# Patient Record
Sex: Male | Born: 1980 | Race: Black or African American | Hispanic: No | Marital: Married | Smoking: Current every day smoker
Health system: Southern US, Community
[De-identification: ages and names within clinical notes are randomized; demographics above are authoritative.]

## PROBLEM LIST (undated history)

## (undated) DIAGNOSIS — K2970 Gastritis, unspecified, without bleeding: Secondary | ICD-10-CM

---

## 2007-08-23 ENCOUNTER — Emergency Department (HOSPITAL_COMMUNITY): Admission: EM | Admit: 2007-08-23 | Discharge: 2007-08-23 | Payer: Self-pay | Admitting: Emergency Medicine

## 2007-11-29 ENCOUNTER — Emergency Department (HOSPITAL_COMMUNITY): Admission: EM | Admit: 2007-11-29 | Discharge: 2007-11-29 | Payer: Self-pay | Admitting: Emergency Medicine

## 2008-03-11 ENCOUNTER — Emergency Department (HOSPITAL_COMMUNITY): Admission: EM | Admit: 2008-03-11 | Discharge: 2008-03-11 | Payer: Self-pay | Admitting: Emergency Medicine

## 2008-04-02 ENCOUNTER — Emergency Department (HOSPITAL_BASED_OUTPATIENT_CLINIC_OR_DEPARTMENT_OTHER): Admission: EM | Admit: 2008-04-02 | Discharge: 2008-04-02 | Payer: Self-pay | Admitting: Emergency Medicine

## 2009-10-28 ENCOUNTER — Emergency Department (HOSPITAL_BASED_OUTPATIENT_CLINIC_OR_DEPARTMENT_OTHER): Admission: EM | Admit: 2009-10-28 | Discharge: 2009-10-28 | Payer: Self-pay | Admitting: Emergency Medicine

## 2010-09-30 LAB — URINALYSIS, ROUTINE W REFLEX MICROSCOPIC
Hgb urine dipstick: NEGATIVE
Protein, ur: NEGATIVE mg/dL
Urobilinogen, UA: 1 mg/dL (ref 0.0–1.0)

## 2010-09-30 LAB — GC/CHLAMYDIA PROBE AMP, GENITAL: GC Probe Amp, Genital: NEGATIVE

## 2011-04-08 LAB — GC/CHLAMYDIA PROBE AMP, GENITAL: GC Probe Amp, Genital: NEGATIVE

## 2011-04-13 LAB — CBC
Hemoglobin: 17.4 — ABNORMAL HIGH
MCHC: 34.2
RDW: 12

## 2011-04-13 LAB — DIFFERENTIAL
Eosinophils Absolute: 0
Lymphocytes Relative: 25
Lymphs Abs: 1.6
Monocytes Relative: 6
Neutro Abs: 4.2
Neutrophils Relative %: 68

## 2011-04-13 LAB — COMPREHENSIVE METABOLIC PANEL
ALT: 15
Calcium: 9.8
Creatinine, Ser: 1
Glucose, Bld: 120 — ABNORMAL HIGH
Sodium: 144
Total Protein: 8.6 — ABNORMAL HIGH

## 2011-04-13 LAB — URINALYSIS, ROUTINE W REFLEX MICROSCOPIC
Glucose, UA: NEGATIVE
Leukocytes, UA: NEGATIVE
Nitrite: NEGATIVE
Specific Gravity, Urine: 1.029
pH: 8

## 2011-04-13 LAB — URINE MICROSCOPIC-ADD ON

## 2011-04-13 LAB — GASTRIC OCCULT BLOOD (1-CARD TO LAB)
Occult Blood, Gastric: POSITIVE — AB
pH, Gastric: 7

## 2011-04-21 ENCOUNTER — Encounter (HOSPITAL_BASED_OUTPATIENT_CLINIC_OR_DEPARTMENT_OTHER): Payer: Self-pay | Admitting: *Deleted

## 2011-04-21 ENCOUNTER — Emergency Department (HOSPITAL_BASED_OUTPATIENT_CLINIC_OR_DEPARTMENT_OTHER)
Admission: EM | Admit: 2011-04-21 | Discharge: 2011-04-21 | Disposition: A | Discharge status: Home / Self Care | Payer: No Typology Code available for payment source | Attending: Emergency Medicine | Admitting: Emergency Medicine

## 2011-04-21 DIAGNOSIS — L729 Follicular cyst of the skin and subcutaneous tissue, unspecified: Secondary | ICD-10-CM

## 2011-04-21 DIAGNOSIS — L723 Sebaceous cyst: Secondary | ICD-10-CM | POA: Insufficient documentation

## 2011-04-21 NOTE — ED Notes (Signed)
Pt states has an area he is concerned about in groin area states it has been there for "a long time" but started out as a small bump and is growing and becoming painful. Denies drainage. Pt also reports he has "knots " that have come up on his scalp when he touches them they itch and "swell up"

## 2011-04-21 NOTE — ED Provider Notes (Signed)
History     CSN: [id number] Arrival date & time: 04/21/2011  6:43 PM  Chief Complaint  Patient presents with  . Cyst    (Consider location/radiation/quality/duration/timing/severity/associated sxs/prior treatment) HPI Chronic left groin inguinal irregular cyst gradual onset over 4 months constantly present which is slowly enlarging with no redness and no pus drainage with no radiation and no associated symptoms. This is a very mild tenderness present 24 hours a day for months. He has no testicular pain no difficulty urinating no abdominal pain no vomiting or other concerns. History reviewed. No pertinent past medical history.  History reviewed. No pertinent past surgical history.  History reviewed. No pertinent family history.  History  Substance Use Topics  . Smoking status: Current Everyday Smoker -- 0.5 packs/day  . Smokeless tobacco: Not on file  . Alcohol Use: Yes     occ      Review of Systems  Constitutional: Negative for fever.       10 Systems reviewed and are negative for acute change except as noted in the HPI.  HENT: Negative for congestion.   Eyes: Negative for discharge and redness.  Respiratory: Negative for cough and shortness of breath.   Cardiovascular: Negative for chest pain.  Gastrointestinal: Negative for vomiting and abdominal pain.  Genitourinary: Negative for dysuria, flank pain and testicular pain.  Musculoskeletal: Negative for back pain.  Skin: Negative for rash.  Neurological: Negative for syncope, numbness and headaches.  Psychiatric/Behavioral:       No behavior change.    Allergies  Review of patient's allergies indicates no known allergies.  Home Medications   Current Outpatient Rx  Name Route Sig Dispense Refill  . NAPROXEN SODIUM 220 MG PO TABS Oral Take 440 mg by mouth 2 (two) times daily as needed. For pain     . BACITRACIN-NEOMYCIN-POLYMYXIN [id number] EX OINT Topical Apply topically daily. apply to eye       BP 143/77   Pulse 76  Temp(Src) 98.4 F (36.9 C) (Oral)  Resp 20  Ht 5\' 8"  (1.727 m)  Wt 260 lb (117.935 kg)  BMI 39.53 kg/m2  SpO2 99%  Physical Exam  Nursing note and vitals reviewed. Constitutional:       Awake, alert, nontoxic appearance.  HENT:  Head: Atraumatic.  Eyes: Right eye exhibits no discharge. Left eye exhibits no discharge.  Neck: Neck supple.  Cardiovascular: Normal rate and regular rhythm.   No murmur heard. Pulmonary/Chest: Effort normal and breath sounds normal. No respiratory distress. He has no wheezes. He has no rales. He exhibits no tenderness.  Abdominal: Soft. There is no tenderness. There is no rebound.  Genitourinary:       No testicular tenderness and no inguinal hernias palpated his left inguinal crease has approximately 1 cm x 3 cm irregular cyst like structure without fluctuance or erythema and minimal if any tenderness at all with limited bedside emergency department ultrasound showing an irregular echogenic appearance with only slight scattered anechoic regions suspected consistent with a complex cyst without need for emergency incision and drainage at this time  Musculoskeletal: Normal range of motion. He exhibits no edema and no tenderness.       Baseline ROM, no obvious new focal weakness.  Neurological:       Mental status and motor strength appears baseline for patient and situation.  Skin: No rash noted.  Psychiatric: He has a normal mood and affect.    ED Course  Procedures (including critical care time)  Labs Reviewed -  No data to display No results found.   1. Cyst of skin       MDM  I doubt any other EMC precluding discharge at this time including, but not necessarily limited to the following:SBI.        Hurman Horn, MD 04/28/11 [phone]

## 2011-04-24 ENCOUNTER — Ambulatory Visit (INDEPENDENT_AMBULATORY_CARE_PROVIDER_SITE_OTHER): Payer: No Typology Code available for payment source | Admitting: Surgery

## 2011-04-24 ENCOUNTER — Encounter (INDEPENDENT_AMBULATORY_CARE_PROVIDER_SITE_OTHER): Payer: Self-pay | Admitting: Surgery

## 2011-04-24 VITALS — BP 138/96 | HR 72 | Temp 97.8°F | Resp 16 | Ht 68.0 in | Wt 271.0 lb

## 2011-04-24 DIAGNOSIS — L732 Hidradenitis suppurativa: Secondary | ICD-10-CM | POA: Insufficient documentation

## 2011-04-24 NOTE — Patient Instructions (Signed)
Hidradenitis Suppurativa, Sweat Gland Abscess Hidradenitis suppurativa is a long lasting (chronic), uncommon disease of the sweat glands. With this, boil-like lumps and scarring develop in the groin, some times under the arms (axillae), and under the breasts. It may also uncommonly occur behind the ears, in the crease of the buttocks, and around the genitals.  CAUSES The cause is from a blocking of the sweat glands. They then become infected. It may cause drainage and odor. It is not contagious. So it cannot be given to someone else. It most often shows up in puberty (about 10 to 31 years of age). But it may happen much later. It is similar to acne which is a disease of the sweat glands. This condition is slightly more common in African-Americans and women. SYMPTOMS  Hidradenitis usually starts as one or more red, tender, swellings in the groin or under the arms (axilla).   Over a period of hours to days the lesions get larger. They often open to the skin surface, draining clear to yellow-colored fluid.   The infected area heals with scarring.  DIAGNOSIS Your caregiver makes this diagnosis by looking at you. Sometimes cultures (growing germs on plates in the lab) may be taken. This is to see what germ (bacterium) is causing the infection.  TREATMENT & HOME CARE INSTRUCTIONS  Topical germ killing medicine applied to the skin (antibiotics) are the treatment of choice. Antibiotics taken by mouth (systemic) are sometimes needed when the condition is getting worse or is severe.   Avoid tight-fitting clothing which traps moisture in.   Dirt does not cause hidradenitis and it is not caused by poor hygiene.   Involved areas should be cleaned daily using an antibacterial soap. Some patients find that the liquid form of Lever 2000, applied to the involved areas as a lotion after bathing, can help reduce the odor related to this condition.   Sometimes surgery is needed to drain infected areas or remove  scarred tissue. Removal of large amounts of tissue is used only in severe cases.   Birth control pills may be helpful.   Oral retinoids (vitamin A derivatives) for 6 to 12 months which are effective for acne may also help this condition.   Weight loss will improve but not cure hidradenitis. It is made worse by being overweight. But the condition is not caused by being overweight.   This condition is more common in people who have had acne.   It may become worse under stress.  There is no medical cure for hidradenitis. It can be controlled, but not cured. The condition usually continues for years with periods of getting worse and getting better (remission). Document Released: 02/11/2004 Document Re-Released: 04/07/2008 [employer] Patient Information 2011 [employer]. 

## 2011-04-24 NOTE — Progress Notes (Signed)
Chief Complaint  Patient presents with  . Other    Eval cyst near groin    HPI Frank Mclaughlin is a 30 y.o. male.  HPI The patient presents at the request of Dr. Fonnie Jarvis due to assist in his left groin. It has been present for a number of months. It is getting larger. It causes minimal discomfort there is no redness, drainage, or evidence of infection. He denies any fever or chills. He denies any change in color of the skin overlying the cyst in his left groin History reviewed. No pertinent past medical history.  History reviewed. No pertinent past surgical history.  History reviewed. No pertinent family history.  Social History History  Substance Use Topics  . Smoking status: Current Everyday Smoker -- 0.5 packs/day  . Smokeless tobacco: Not on file  . Alcohol Use: Yes     occ    No Known Allergies  Current Outpatient Prescriptions  Medication Sig Dispense Refill  . neomycin-bacitracin-polymyxin (NEOSPORIN) ointment Apply topically daily. apply to eye       . naproxen sodium (ANAPROX) 220 MG tablet Take 440 mg by mouth 2 (two) times daily as needed. For pain         Review of Systems Review of Systems  Constitutional: Negative for fever and chills.  HENT: Negative for facial swelling and neck pain.   Eyes: Negative.   Respiratory: Negative.   Cardiovascular: Negative.   Gastrointestinal: Negative.   Genitourinary: Negative.   Musculoskeletal: Negative.   Neurological: Negative.   Hematological: Negative.   Psychiatric/Behavioral: Negative.     Blood pressure 138/96, pulse 72, temperature 97.8 F (36.6 C), temperature source Temporal, resp. rate 16, height 5\' 8"  (1.727 m), weight 271 lb (122.925 kg).  Physical Exam Physical Exam  Constitutional: He is oriented to person, place, and time. He appears well-developed and well-nourished.  HENT:  Head: Normocephalic and atraumatic.  Nose: Nose normal.  Eyes: EOM are normal. Pupils are equal, round, and reactive to  light.  Neck: Normal range of motion. Neck supple.  Cardiovascular: Normal rate and regular rhythm.   No murmur heard. Pulmonary/Chest: Effort normal and breath sounds normal.  Abdominal: Soft. Bowel sounds are normal.  Musculoskeletal: Normal range of motion.  Neurological: He is alert and oriented to person, place, and time.  Skin:       Left groin hidradenitis measuring 4 cm x 5 cm.  No inflammation or tenderness.  Psychiatric: He has a normal mood and affect. His behavior is normal. Judgment and thought content normal.    Data Reviewed ED note  Assessment    Left inguinal hidradenitis     Plan    I discussed the condition with the patient today. He does have hidradenitis of his left groin and inguinal region measuring 4 x 5 cm. There is no signs of inflammation or infection. I discussed options of observation versus excision. I discussed potential risk of infection if excision is not done. I discussed the procedure of excising this area. Risks include bleeding, infection, recurrence, injury to nerves, blood vessels, and other neighboring structures. Other risks include blood clots, heart problems, stroke, pulmonary problems, death. She will call to schedule once he moves his work schedule.The likelihood of relieving the patient's symptoms with surgery is 50-75%.       Frank Rolfson A. 04/24/2011, 9:54 AM

## 2013-10-13 ENCOUNTER — Encounter (HOSPITAL_BASED_OUTPATIENT_CLINIC_OR_DEPARTMENT_OTHER): Payer: Self-pay | Admitting: Emergency Medicine

## 2013-10-13 ENCOUNTER — Emergency Department (HOSPITAL_BASED_OUTPATIENT_CLINIC_OR_DEPARTMENT_OTHER)
Admission: EM | Admit: 2013-10-13 | Discharge: 2013-10-13 | Disposition: A | Discharge status: Home / Self Care | Payer: No Typology Code available for payment source | Attending: Emergency Medicine | Admitting: Emergency Medicine

## 2013-10-13 DIAGNOSIS — F17200 Nicotine dependence, unspecified, uncomplicated: Secondary | ICD-10-CM | POA: Insufficient documentation

## 2013-10-13 DIAGNOSIS — J020 Streptococcal pharyngitis: Secondary | ICD-10-CM | POA: Insufficient documentation

## 2013-10-13 LAB — RAPID STREP SCREEN (MED CTR MEBANE ONLY): STREPTOCOCCUS, GROUP A SCREEN (DIRECT): POSITIVE — AB

## 2013-10-13 MED ORDER — KETOROLAC TROMETHAMINE 30 MG/ML IJ SOLN
30.0000 mg | Freq: Once | INTRAMUSCULAR | Status: AC
  Administered 2013-10-13: 30 mg via INTRAMUSCULAR
  Filled 2013-10-13: qty 1

## 2013-10-13 MED ORDER — LIDOCAINE VISCOUS 2 % MT SOLN
15.0000 mL | Freq: Once | OROMUCOSAL | Status: AC
  Administered 2013-10-13: 15 mL via OROMUCOSAL
  Filled 2013-10-13: qty 15

## 2013-10-13 MED ORDER — LIDOCAINE VISCOUS 2 % MT SOLN: 15.0000 mL | OROMUCOSAL | Status: AC | PRN

## 2013-10-13 MED ORDER — DEXAMETHASONE 4 MG PO TABS
ORAL_TABLET | ORAL | Status: AC
  Administered 2013-10-13: 10 mg
  Filled 2013-10-13: qty 3

## 2013-10-13 MED ORDER — DEXAMETHASONE 4 MG PO TABS: 10.0000 mg | ORAL_TABLET | Freq: Once | ORAL | Status: DC

## 2013-10-13 MED ORDER — PENICILLIN G BENZATHINE 1200000 UNIT/2ML IM SUSP
1.2000 10*6.[IU] | Freq: Once | INTRAMUSCULAR | Status: AC
  Administered 2013-10-13: 1.2 10*6.[IU] via INTRAMUSCULAR
  Filled 2013-10-13: qty 2

## 2013-10-13 NOTE — ED Notes (Signed)
Sore throat since Monday, no relief from OTC meds

## 2013-10-13 NOTE — ED Provider Notes (Signed)
CSN: [id number]     Arrival date & time 10/13/13  0230 History   First MD Initiated Contact with Patient 10/13/13 [phone]     Chief Complaint  Patient presents with  . Sore Throat     (Consider location/radiation/quality/duration/timing/severity/associated sxs/prior Treatment) HPI Patient presents with sore throat. Symptoms began 4 days ago without clear precipitant.  No relief from OTC medication. There is associated pain with swallowing, speaking, but no dyspnea, chest pain, fever, chills, no vomiting, no diarrhea. Patient's generally well. Patient smokes, was counseled on the need to stop this behavior.  History reviewed. No pertinent past medical history. History reviewed. No pertinent past surgical history. History reviewed. No pertinent family history. History  Substance Use Topics  . Smoking status: Current Every Day Smoker -- 0.50 packs/day  . Smokeless tobacco: Not on file  . Alcohol Use: Yes     Comment: occ    Review of Systems  Constitutional:       Per HPI, otherwise negative  HENT:       Per HPI, otherwise negative  Respiratory:       Per HPI, otherwise negative  Cardiovascular:       Per HPI, otherwise negative  Gastrointestinal: Negative for vomiting.  Endocrine:       Negative aside from HPI  Genitourinary:       Neg aside from HPI   Musculoskeletal:       Per HPI, otherwise negative  Skin: Negative.   Neurological: Negative for syncope.      Allergies  Review of patient's allergies indicates no known allergies.  Home Medications   Current Outpatient Rx  Name  Route  Sig  Dispense  Refill  . lidocaine (XYLOCAINE) 2 % solution   Mouth/Throat   Use as directed 15 mLs in the mouth or throat every 3 (three) hours as needed for mouth pain.   100 mL   0   . naproxen sodium (ANAPROX) 220 MG tablet   Oral   Take 440 mg by mouth 2 (two) times daily as needed. For pain          . neomycin-bacitracin-polymyxin (NEOSPORIN) ointment   Topical  Apply topically daily. apply to eye           BP 131/80  Pulse 77  Temp(Src) 98.7 F (37.1 C) (Oral)  Resp 20  SpO2 100% Physical Exam  Nursing note and vitals reviewed. Constitutional: He is oriented to person, place, and time. He appears well-developed. No distress.  HENT:  Head: Normocephalic and atraumatic.  Symmetrically enlarged tonsils bilaterally, erythematous, no gross discharge  Eyes: Conjunctivae and EOM are normal.  Cardiovascular: Normal rate and regular rhythm.   Pulmonary/Chest: Effort normal. No stridor. No respiratory distress.  Abdominal: He exhibits no distension.  Musculoskeletal: He exhibits no edema.  Lymphadenopathy:       Right cervical: No superficial cervical and no posterior cervical adenopathy present.      Left cervical: No superficial cervical and no posterior cervical adenopathy present.  Neurological: He is alert and oriented to person, place, and time.  Skin: Skin is warm and dry.  Psychiatric: He has a normal mood and affect.    ED Course  Procedures (including critical care time) Labs Review Labs Reviewed  RAPID STREP SCREEN - Abnormal; Notable for the following:    Streptococcus, Group A Screen (Direct) POSITIVE (*)    All other components within normal limits    MDM   Final diagnoses:  Strep throat  This generally well male presents with sore throat for several days.  On exam he is awake, alert, with no airway compromise, fever, evidence of distress.  Patient was discharged in stable condition after initiation of antibiotics.    Gerhard Munchobert Becca Bayne, MD 10/13/13 [phone]

## 2018-07-17 ENCOUNTER — Encounter (HOSPITAL_BASED_OUTPATIENT_CLINIC_OR_DEPARTMENT_OTHER): Payer: Self-pay | Admitting: Emergency Medicine

## 2018-07-17 ENCOUNTER — Emergency Department (HOSPITAL_BASED_OUTPATIENT_CLINIC_OR_DEPARTMENT_OTHER)
Admission: EM | Admit: 2018-07-17 | Discharge: 2018-07-17 | Disposition: A | Discharge status: Home / Self Care | Payer: No Typology Code available for payment source | Attending: Emergency Medicine | Admitting: Emergency Medicine

## 2018-07-17 ENCOUNTER — Emergency Department (HOSPITAL_BASED_OUTPATIENT_CLINIC_OR_DEPARTMENT_OTHER): Payer: No Typology Code available for payment source

## 2018-07-17 ENCOUNTER — Other Ambulatory Visit: Payer: Self-pay

## 2018-07-17 DIAGNOSIS — Z79899 Other long term (current) drug therapy: Secondary | ICD-10-CM | POA: Insufficient documentation

## 2018-07-17 DIAGNOSIS — F17200 Nicotine dependence, unspecified, uncomplicated: Secondary | ICD-10-CM | POA: Insufficient documentation

## 2018-07-17 DIAGNOSIS — J029 Acute pharyngitis, unspecified: Secondary | ICD-10-CM | POA: Insufficient documentation

## 2018-07-17 LAB — GROUP A STREP BY PCR: GROUP A STREP BY PCR: NOT DETECTED

## 2018-07-17 MED ORDER — PANTOPRAZOLE SODIUM 20 MG PO TBEC: 20.0000 mg | DELAYED_RELEASE_TABLET | Freq: Every day | ORAL | 0 refills | Status: AC

## 2018-07-17 MED ORDER — ALUM & MAG HYDROXIDE-SIMETH 200-200-20 MG/5ML PO SUSP
30.0000 mL | Freq: Once | ORAL | Status: AC
  Administered 2018-07-17: 30 mL via ORAL
  Filled 2018-07-17: qty 30

## 2018-07-17 NOTE — ED Triage Notes (Signed)
Sore throat and frequent hiccups since Thursday

## 2018-07-17 NOTE — ED Notes (Signed)
ED Provider at bedside. 

## 2018-07-17 NOTE — ED Provider Notes (Signed)
[facility] EMERGENCY DEPARTMENT Provider Note   CSN: [id number] Arrival date & time: 07/17/18  0915     History   Chief Complaint Chief Complaint  Patient presents with  . Sore Throat    HPI Frank Mclaughlin is a 38 y.o. male.  Patient is a 38 year old male who presents with a sore throat.  He states he feels like there is a lump in the middle of his throat.  He has pain on swallowing.  He says he feels like he has intermittent indigestion and when he gets a pain in the middle of his throat he has a painful hiccup.  He denies any shortness of breath.  No change in his voice.  He felt like he had a little fever yesterday but has not had any known fevers.  No runny nose or nasal congestion.  No vomiting.  No history of increased reflux.  No associated chest pain.     History reviewed. No pertinent past medical history.  Patient Active Problem List   Diagnosis Date Noted  . Hidradenitis 04/24/2011    History reviewed. No pertinent surgical history.      Home Medications    Prior to Admission medications   Medication Sig Start Date End Date Taking? Authorizing Provider  lidocaine (XYLOCAINE) 2 % solution Use as directed 15 mLs in the mouth or throat every 3 (three) hours as needed for mouth pain. 10/13/13   Gerhard Munch, MD  naproxen sodium (ANAPROX) 220 MG tablet Take 440 mg by mouth 2 (two) times daily as needed. For pain     [provider]  neomycin-bacitracin-polymyxin (NEOSPORIN) ointment Apply topically daily. apply to eye     [provider]  pantoprazole (PROTONIX) 20 MG tablet Take 1 tablet (20 mg total) by mouth daily. 07/17/18   Rolan Bucco, MD    Family History No family history on file.  Social History Social History   Tobacco Use  . Smoking status: Current Every Day Smoker    Packs/day: 0.50  . Smokeless tobacco: Never Used  Substance Use Topics  . Alcohol use: Yes    Comment: occ  . Drug use: Yes    Comment:  occasional -marijuana     Allergies   Patient has no allergy information on record.   Review of Systems Review of Systems  Constitutional: Negative for chills, diaphoresis, fatigue and fever.  HENT: Positive for sore throat. Negative for congestion, rhinorrhea and sneezing.   Eyes: Negative.   Respiratory: Negative for cough, chest tightness and shortness of breath.   Cardiovascular: Negative for chest pain and leg swelling.  Gastrointestinal: Negative for abdominal pain, blood in stool, diarrhea, nausea and vomiting.  Genitourinary: Negative for difficulty urinating, flank pain, frequency and hematuria.  Musculoskeletal: Negative for arthralgias and back pain.  Skin: Negative for rash.  Neurological: Negative for dizziness, speech difficulty, weakness, numbness and headaches.     Physical Exam Updated Vital Signs BP (!) 134/91 (BP Location: Right Arm)   Pulse 98   Temp 99.3 F (37.4 C) (Oral)   Resp 16   Ht 5\' 8"  (1.727 m)   Wt 106.1 kg   SpO2 99%   BMI 35.58 kg/m   Physical Exam Constitutional:      Appearance: He is well-developed.  HENT:     Head: Normocephalic and atraumatic.     Mouth/Throat:     Mouth: Mucous membranes are moist.     Pharynx: No uvula swelling.  Tonsils: No tonsillar exudate or tonsillar abscesses.     Comments: No visible swelling noted to the neck, no crepitus, oropharynx is clear without erythema or exudates.  No trismus.  Uvula is midline.  No elevation of the tongue. Eyes:     Pupils: Pupils are equal, round, and reactive to light.  Neck:     Musculoskeletal: Normal range of motion and neck supple.  Cardiovascular:     Rate and Rhythm: Normal rate and regular rhythm.     Heart sounds: Normal heart sounds.  Pulmonary:     Effort: Pulmonary effort is normal. No respiratory distress.     Breath sounds: Normal breath sounds. No wheezing or rales.  Chest:     Chest wall: No tenderness.  Abdominal:     General: Bowel sounds are  normal.     Palpations: Abdomen is soft.     Tenderness: There is no abdominal tenderness. There is no guarding or rebound.  Musculoskeletal: Normal range of motion.  Lymphadenopathy:     Cervical: No cervical adenopathy.  Skin:    General: Skin is warm and dry.     Findings: No rash.  Neurological:     Mental Status: He is alert and oriented to person, place, and time.      ED Treatments / Results  Labs (all labs ordered are listed, but only abnormal results are displayed) Labs Reviewed  GROUP A STREP BY PCR    EKG None  Radiology Dg Neck Soft Tissue  Result Date: 07/17/2018 CLINICAL DATA:  Sore throat and headache ups over the last 4 days. EXAM: NECK SOFT TISSUES - 1+ VIEW COMPARISON:  None. FINDINGS: Soft tissue and air shadows appear within normal limits. No evidence of airway compromise. No evidence of retropharyngeal abscess. No significant bone finding. IMPRESSION: Within normal limits. Electronically Signed   By: Paulina Fusi M.D.   On: 07/17/2018 10:09   Dg Chest 2 View  Result Date: 07/17/2018 CLINICAL DATA:  Sore throat and hip cops over the last 4 days. EXAM: CHEST - 2 VIEW COMPARISON:  08/23/2007 FINDINGS: Heart size is normal. Mediastinal shadows are normal. The lungs are clear. No bronchial thickening. No infiltrate, mass, effusion or collapse. Pulmonary vascularity is normal. No bony abnormality. IMPRESSION: Normal chest. Electronically Signed   By: Paulina Fusi M.D.   On: 07/17/2018 10:09    Procedures Procedures (including critical care time)  Medications Ordered in ED Medications  alum & mag hydroxide-simeth (MAALOX/MYLANTA) 200-200-20 MG/5ML suspension 30 mL (30 mLs Oral Given 07/17/18 1022)     Initial Impression / Assessment and Plan / ED Course  I have reviewed the triage vital signs and the nursing notes.  Pertinent labs & imaging results that were available during my care of the patient were reviewed by me and considered in my medical decision  making (see chart for details).     Patient is a 38 year old male who presents with some throat discomfort.  He feels like there is a lump in his throat.  He also has some intermittent hiccups.  No chest pain or shortness of breath.  No difficulty swallowing food or secretions.  He says it hurts with the hiccups.  His throat exam is benign.  He does not have any visible swelling.  He has full range of motion in his neck.  No evidence of free air or swelling of his epiglottis on x-rays.  He does not have other findings that would be more concerning for retropharyngeal abscess  or other deep tissue infection.  His strep test is negative.  He was discharged home in good condition.  He was started on antiacid medications as this may just be some irritation of his esophagus.  He was given a referral to follow-up with GI if his symptoms are not improving.  Return precautions were given.  Final Clinical Impressions(s) / ED Diagnoses   Final diagnoses:  Sore throat    ED Discharge Orders         Ordered    pantoprazole (PROTONIX) 20 MG tablet  Daily     07/17/18 1047           Rolan BuccoBelfi, Anjenette Gerbino, MD 07/17/18 1049

## 2018-10-16 ENCOUNTER — Other Ambulatory Visit: Payer: Self-pay

## 2018-10-16 ENCOUNTER — Encounter (HOSPITAL_BASED_OUTPATIENT_CLINIC_OR_DEPARTMENT_OTHER): Payer: Self-pay | Admitting: Emergency Medicine

## 2018-10-16 ENCOUNTER — Emergency Department (HOSPITAL_BASED_OUTPATIENT_CLINIC_OR_DEPARTMENT_OTHER)
Admission: EM | Admit: 2018-10-16 | Discharge: 2018-10-16 | Disposition: A | Discharge status: Home / Self Care | Payer: Self-pay | Attending: Emergency Medicine | Admitting: Emergency Medicine

## 2018-10-16 DIAGNOSIS — Z79899 Other long term (current) drug therapy: Secondary | ICD-10-CM | POA: Insufficient documentation

## 2018-10-16 DIAGNOSIS — F17210 Nicotine dependence, cigarettes, uncomplicated: Secondary | ICD-10-CM | POA: Insufficient documentation

## 2018-10-16 DIAGNOSIS — R112 Nausea with vomiting, unspecified: Secondary | ICD-10-CM | POA: Insufficient documentation

## 2018-10-16 LAB — CBC WITH DIFFERENTIAL/PLATELET
Abs Immature Granulocytes: 0.02 10*3/uL (ref 0.00–0.07)
Basophils Absolute: 0 10*3/uL (ref 0.0–0.1)
Basophils Relative: 0 %
Eosinophils Absolute: 0 10*3/uL (ref 0.0–0.5)
Eosinophils Relative: 0 %
HCT: 52.6 % — ABNORMAL HIGH (ref 39.0–52.0)
Hemoglobin: 18.2 g/dL — ABNORMAL HIGH (ref 13.0–17.0)
Immature Granulocytes: 0 %
Lymphocytes Relative: 21 %
Lymphs Abs: 1.6 10*3/uL (ref 0.7–4.0)
MCH: 30.7 pg (ref 26.0–34.0)
MCHC: 34.6 g/dL (ref 30.0–36.0)
MCV: 88.9 fL (ref 80.0–100.0)
Monocytes Absolute: 0.4 10*3/uL (ref 0.1–1.0)
Monocytes Relative: 5 %
Neutro Abs: 5.8 10*3/uL (ref 1.7–7.7)
Neutrophils Relative %: 74 %
Platelets: 277 10*3/uL (ref 150–400)
RBC: 5.92 MIL/uL — ABNORMAL HIGH (ref 4.22–5.81)
RDW: 11.9 % (ref 11.5–15.5)
WBC: 7.9 10*3/uL (ref 4.0–10.5)
nRBC: 0 % (ref 0.0–0.2)

## 2018-10-16 LAB — COMPREHENSIVE METABOLIC PANEL
ALT: 19 U/L (ref 0–44)
AST: 19 U/L (ref 15–41)
Albumin: 4.7 g/dL (ref 3.5–5.0)
Alkaline Phosphatase: 80 U/L (ref 38–126)
Anion gap: 10 (ref 5–15)
BUN: 5 mg/dL — ABNORMAL LOW (ref 6–20)
CO2: 24 mmol/L (ref 22–32)
Calcium: 9.7 mg/dL (ref 8.9–10.3)
Chloride: 104 mmol/L (ref 98–111)
Creatinine, Ser: 0.81 mg/dL (ref 0.61–1.24)
GFR calc Af Amer: >60 mL/min (ref >60)
GFR calc non Af Amer: >60 mL/min (ref >60)
Glucose, Bld: 224 mg/dL — ABNORMAL HIGH (ref 70–99)
Potassium: 3.7 mmol/L (ref 3.5–5.1)
Sodium: 138 mmol/L (ref 135–145)
Total Bilirubin: 0.8 mg/dL (ref 0.3–1.2)
Total Protein: 8.2 g/dL — ABNORMAL HIGH (ref 6.5–8.1)

## 2018-10-16 LAB — LIPASE, BLOOD: Lipase: 25 U/L (ref 11–51)

## 2018-10-16 MED ORDER — ONDANSETRON 4 MG PO TBDP: ORAL_TABLET | ORAL | 0 refills | Status: DC

## 2018-10-16 MED ORDER — FAMOTIDINE IN NACL 20-0.9 MG/50ML-% IV SOLN
20.0000 mg | Freq: Once | INTRAVENOUS | Status: AC
  Administered 2018-10-16: 19:00:00 20 mg via INTRAVENOUS
  Filled 2018-10-16: qty 50

## 2018-10-16 MED ORDER — ONDANSETRON HCL 4 MG/2ML IJ SOLN
4.0000 mg | Freq: Once | INTRAMUSCULAR | Status: AC
  Administered 2018-10-16: 19:00:00 4 mg via INTRAVENOUS
  Filled 2018-10-16: qty 2

## 2018-10-16 MED ORDER — ONDANSETRON HCL 4 MG/2ML IJ SOLN
4.0000 mg | Freq: Once | INTRAMUSCULAR | Status: AC
  Administered 2018-10-16: 18:00:00 4 mg via INTRAVENOUS
  Filled 2018-10-16: qty 2

## 2018-10-16 MED ORDER — ALUM & MAG HYDROXIDE-SIMETH 200-200-20 MG/5ML PO SUSP
30.0000 mL | Freq: Once | ORAL | Status: AC
  Administered 2018-10-16: 18:00:00 30 mL via ORAL
  Filled 2018-10-16: qty 30

## 2018-10-16 MED ORDER — SODIUM CHLORIDE 0.9 % IV BOLUS
1000.0000 mL | Freq: Once | INTRAVENOUS | Status: AC
  Administered 2018-10-16: 1000 mL via INTRAVENOUS

## 2018-10-16 NOTE — ED Triage Notes (Signed)
N/V since this morning. Denies diarrhea.

## 2018-10-16 NOTE — ED Provider Notes (Signed)
[facility] EMERGENCY DEPARTMENT Provider Note   CSN: [id number] Arrival date & time: 10/16/18  1721    History   Chief Complaint Chief Complaint  Patient presents with  . Emesis    HPI Frank Mclaughlin is a 38 y.o. male.     38 yo M with a chief complaint of nausea and vomiting.  Started last night and persisted into this morning.  Feels like is not able to keep anything down.  Having some epigastric and left-sided abdominal pain.  Describes it as burning.  Feels like it is reflux and building up and then makes him vomit.  He has had subjective chills but denies fevers.  The history is provided by the patient.  Emesis  Severity:  Moderate Duration:  2 days Timing:  Constant Quality:  Stomach contents Able to tolerate:  Liquids Progression:  Worsening Chronicity:  New Recent urination:  Normal Context: not post-tussive and not self-induced   Relieved by:  Nothing Worsened by:  Nothing Ineffective treatments:  None tried Associated symptoms: abdominal pain   Associated symptoms: no arthralgias, no chills, no diarrhea, no fever, no headaches and no myalgias     History reviewed. No pertinent past medical history.  Patient Active Problem List   Diagnosis Date Noted  . Hidradenitis 04/24/2011    History reviewed. No pertinent surgical history.      Home Medications    Prior to Admission medications   Medication Sig Start Date End Date Taking? Authorizing Provider  lidocaine (XYLOCAINE) 2 % solution Use as directed 15 mLs in the mouth or throat every 3 (three) hours as needed for mouth pain. 10/13/13   Gerhard Munch, MD  naproxen sodium (ANAPROX) 220 MG tablet Take 440 mg by mouth 2 (two) times daily as needed. For pain     [provider]  neomycin-bacitracin-polymyxin (NEOSPORIN) ointment Apply topically daily. apply to eye     [provider]  ondansetron (ZOFRAN ODT) 4 MG disintegrating tablet 4mg  ODT q4 hours prn nausea/vomit  10/16/18   Melene Plan, DO  pantoprazole (PROTONIX) 20 MG tablet Take 1 tablet (20 mg total) by mouth daily. 07/17/18   Rolan Bucco, MD    Family History No family history on file.  Social History Social History   Tobacco Use  . Smoking status: Current Every Day Smoker    Packs/day: 0.50  . Smokeless tobacco: Never Used  Substance Use Topics  . Alcohol use: Yes    Comment: occ  . Drug use: Yes    Comment: occasional -marijuana     Allergies   Patient has no known allergies.   Review of Systems Review of Systems  Constitutional: Negative for chills and fever.  HENT: Negative for congestion and facial swelling.   Eyes: Negative for discharge and visual disturbance.  Respiratory: Negative for shortness of breath.   Cardiovascular: Negative for chest pain and palpitations.  Gastrointestinal: Positive for abdominal pain, nausea and vomiting. Negative for diarrhea.  Musculoskeletal: Negative for arthralgias and myalgias.  Skin: Negative for color change and rash.  Neurological: Negative for tremors, syncope and headaches.  Psychiatric/Behavioral: Negative for confusion and dysphoric mood.     Physical Exam Updated Vital Signs BP (!) 156/111 (BP Location: Left Arm)   Pulse 95   Temp 98.8 F (37.1 C) (Oral)   Resp 18   Ht 5\' 10"  (1.778 m)   Wt 106.6 kg   SpO2 98%   BMI 33.72 kg/m   Physical Exam Vitals signs  and nursing note reviewed.  Constitutional:      Appearance: He is well-developed.  HENT:     Head: Normocephalic and atraumatic.  Eyes:     Pupils: Pupils are equal, round, and reactive to light.  Neck:     Musculoskeletal: Normal range of motion and neck supple.     Vascular: No JVD.  Cardiovascular:     Rate and Rhythm: Normal rate and regular rhythm.     Heart sounds: No murmur. No friction rub. No gallop.   Pulmonary:     Effort: No respiratory distress.     Breath sounds: No wheezing.  Abdominal:     General: There is no distension.      Tenderness: There is abdominal tenderness (mild diffuse tenderness about the left abdomen.  Epigatric tenderness.  No appreciable pain to the right lower right upper quadrant.). There is no guarding or rebound.  Musculoskeletal: Normal range of motion.  Skin:    Coloration: Skin is not pale.     Findings: No rash.  Neurological:     Mental Status: He is alert and oriented to person, place, and time.  Psychiatric:        Behavior: Behavior normal.      ED Treatments / Results  Labs (all labs ordered are listed, but only abnormal results are displayed) Labs Reviewed  CBC WITH DIFFERENTIAL/PLATELET - Abnormal; Notable for the following components:      Result Value   RBC 5.92 (*)    Hemoglobin 18.2 (*)    HCT 52.6 (*)    All other components within normal limits  COMPREHENSIVE METABOLIC PANEL - Abnormal; Notable for the following components:   Glucose, Bld 224 (*)    BUN 5 (*)    Total Protein 8.2 (*)    All other components within normal limits  LIPASE, BLOOD    EKG None  Radiology No results found.  Procedures Procedures (including critical care time)  Medications Ordered in ED Medications  famotidine (PEPCID) IVPB 20 mg premix (20 mg Intravenous New Bag/Given 10/16/18 1849)  sodium chloride 0.9 % bolus 1,000 mL (1,000 mLs Intravenous New Bag/Given 10/16/18 1802)  ondansetron (ZOFRAN) injection 4 mg (4 mg Intravenous Given 10/16/18 1758)  alum & mag hydroxide-simeth (MAALOX/MYLANTA) 200-200-20 MG/5ML suspension 30 mL (30 mLs Oral Given 10/16/18 1758)  ondansetron (ZOFRAN) injection 4 mg (4 mg Intravenous Given 10/16/18 1846)     Initial Impression / Assessment and Plan / ED Course  I have reviewed the triage vital signs and the nursing notes.  Pertinent labs & imaging results that were available during my care of the patient were reviewed by me and considered in my medical decision making (see chart for details).  Clinical Course as of Oct 16 1910  Sun Oct 16, 2018  1601  Lab work has returned without LFT elevation or elevation of lipase.  White blood cell count is normal.  Not anemic.  Patient reassessed and feeling much better but still somewhat nauseated.  Was able to tolerate p.o. without difficulty here.  We will give dose of a H2 blocker and another dose of Zofran.   [DF]    Clinical Course User Index [DF] Melene Plan, DO       38 yo M with a chief complaint of nausea and vomiting.  Started yesterday and persisted into today.  Patient is well-appearing and nontoxic.  Very mild abdominal pain on exam.  Will give fluid bolus check labs give Zofran Maalox and reassess.  Continuing to tolerate po. D/c home.   7:12 PM:  I have discussed the diagnosis/risks/treatment options with the patient and believe the pt to be eligible for discharge home to follow-up with PCP. We also discussed returning to the ED immediately if new or worsening sx occur. We discussed the sx which are most concerning (e.g., sudden worsening pain, fever, inability to tolerate by mouth) that necessitate immediate return. Medications administered to the patient during their visit and any new prescriptions provided to the patient are listed below.  Medications given during this visit Medications  famotidine (PEPCID) IVPB 20 mg premix (20 mg Intravenous New Bag/Given 10/16/18 1849)  sodium chloride 0.9 % bolus 1,000 mL (1,000 mLs Intravenous New Bag/Given 10/16/18 1802)  ondansetron (ZOFRAN) injection 4 mg (4 mg Intravenous Given 10/16/18 1758)  alum & mag hydroxide-simeth (MAALOX/MYLANTA) 200-200-20 MG/5ML suspension 30 mL (30 mLs Oral Given 10/16/18 1758)  ondansetron (ZOFRAN) injection 4 mg (4 mg Intravenous Given 10/16/18 1846)     The patient appears reasonably screen and/or stabilized for discharge and I doubt any other medical condition or other [facility] requiring further screening, evaluation, or treatment in the ED at this time prior to discharge.    Final Clinical Impressions(s) / ED Diagnoses    Final diagnoses:  Nausea and vomiting in adult    ED Discharge Orders         Ordered    ondansetron (ZOFRAN ODT) 4 MG disintegrating tablet     10/16/18 1911           Melene Plan, DO 10/16/18 1912

## 2018-10-16 NOTE — ED Notes (Signed)
Pt verbalized understanding of discharge instructions.

## 2018-10-16 NOTE — Discharge Instructions (Signed)
Try zantac or pepcid twice a day.  Try to avoid things that may make this worse, most commonly these are spicy foods tomato based products fatty foods chocolate and peppermint.  Alcohol and tobacco can also make this worse.  Return to the emergency department for sudden worsening pain fever or inability to eat or drink. ° °

## 2018-10-18 ENCOUNTER — Encounter (HOSPITAL_BASED_OUTPATIENT_CLINIC_OR_DEPARTMENT_OTHER): Payer: Self-pay | Admitting: *Deleted

## 2018-10-18 ENCOUNTER — Other Ambulatory Visit: Payer: Self-pay

## 2018-10-18 ENCOUNTER — Emergency Department (HOSPITAL_BASED_OUTPATIENT_CLINIC_OR_DEPARTMENT_OTHER)
Admission: EM | Admit: 2018-10-18 | Discharge: 2018-10-18 | Disposition: A | Discharge status: Home / Self Care | Payer: Self-pay | Attending: Emergency Medicine | Admitting: Emergency Medicine

## 2018-10-18 DIAGNOSIS — F17200 Nicotine dependence, unspecified, uncomplicated: Secondary | ICD-10-CM | POA: Insufficient documentation

## 2018-10-18 DIAGNOSIS — R112 Nausea with vomiting, unspecified: Secondary | ICD-10-CM | POA: Insufficient documentation

## 2018-10-18 DIAGNOSIS — R739 Hyperglycemia, unspecified: Secondary | ICD-10-CM

## 2018-10-18 DIAGNOSIS — Z79899 Other long term (current) drug therapy: Secondary | ICD-10-CM | POA: Insufficient documentation

## 2018-10-18 DIAGNOSIS — R1012 Left upper quadrant pain: Secondary | ICD-10-CM | POA: Insufficient documentation

## 2018-10-18 DIAGNOSIS — R1010 Upper abdominal pain, unspecified: Secondary | ICD-10-CM

## 2018-10-18 LAB — CBC WITH DIFFERENTIAL/PLATELET
Abs Immature Granulocytes: 0.01 10*3/uL (ref 0.00–0.07)
Basophils Absolute: 0 10*3/uL (ref 0.0–0.1)
Basophils Relative: 0 %
Eosinophils Absolute: 0 10*3/uL (ref 0.0–0.5)
Eosinophils Relative: 0 %
HCT: 50.5 % (ref 39.0–52.0)
Hemoglobin: 17.7 g/dL — ABNORMAL HIGH (ref 13.0–17.0)
Immature Granulocytes: 0 %
Lymphocytes Relative: 20 %
Lymphs Abs: 1.4 10*3/uL (ref 0.7–4.0)
MCH: 30.8 pg (ref 26.0–34.0)
MCHC: 35 g/dL (ref 30.0–36.0)
MCV: 88 fL (ref 80.0–100.0)
Monocytes Absolute: 0.5 10*3/uL (ref 0.1–1.0)
Monocytes Relative: 7 %
Neutro Abs: 5.1 10*3/uL (ref 1.7–7.7)
Neutrophils Relative %: 73 %
Platelets: 270 10*3/uL (ref 150–400)
RBC: 5.74 MIL/uL (ref 4.22–5.81)
RDW: 12 % (ref 11.5–15.5)
WBC: 7 10*3/uL (ref 4.0–10.5)
nRBC: 0 % (ref 0.0–0.2)

## 2018-10-18 LAB — COMPREHENSIVE METABOLIC PANEL
ALT: 17 U/L (ref 0–44)
AST: 14 U/L — ABNORMAL LOW (ref 15–41)
Albumin: 4.5 g/dL (ref 3.5–5.0)
Alkaline Phosphatase: 73 U/L (ref 38–126)
Anion gap: 11 (ref 5–15)
BUN: 11 mg/dL (ref 6–20)
CO2: 25 mmol/L (ref 22–32)
Calcium: 9.4 mg/dL (ref 8.9–10.3)
Chloride: 98 mmol/L (ref 98–111)
Creatinine, Ser: 1.06 mg/dL (ref 0.61–1.24)
GFR calc Af Amer: >60 mL/min (ref >60)
GFR calc non Af Amer: >60 mL/min (ref >60)
Glucose, Bld: 297 mg/dL — ABNORMAL HIGH (ref 70–99)
Potassium: 4 mmol/L (ref 3.5–5.1)
Sodium: 134 mmol/L — ABNORMAL LOW (ref 135–145)
Total Bilirubin: 1 mg/dL (ref 0.3–1.2)
Total Protein: 7.9 g/dL (ref 6.5–8.1)

## 2018-10-18 LAB — RAPID URINE DRUG SCREEN, HOSP PERFORMED
Amphetamines: NOT DETECTED
Barbiturates: NOT DETECTED
Benzodiazepines: NOT DETECTED
Cocaine: NOT DETECTED
Opiates: NOT DETECTED
Tetrahydrocannabinol: POSITIVE — AB

## 2018-10-18 LAB — LIPASE, BLOOD: Lipase: 29 U/L (ref 11–51)

## 2018-10-18 LAB — LACTIC ACID, PLASMA: Lactic Acid, Venous: 1.7 mmol/L (ref 0.5–1.9)

## 2018-10-18 MED ORDER — DROPERIDOL 2.5 MG/ML IJ SOLN
2.5000 mg | Freq: Once | INTRAMUSCULAR | Status: AC
  Administered 2018-10-18: 16:00:00 2.5 mg via INTRAVENOUS

## 2018-10-18 MED ORDER — OMEPRAZOLE 20 MG PO CPDR: 20.0000 mg | DELAYED_RELEASE_CAPSULE | Freq: Every day | ORAL | 0 refills | Status: DC

## 2018-10-18 MED ORDER — ALUM & MAG HYDROXIDE-SIMETH 200-200-20 MG/5ML PO SUSP
30.0000 mL | Freq: Once | ORAL | Status: AC
  Administered 2018-10-18: 14:00:00 30 mL via ORAL
  Filled 2018-10-18: qty 30

## 2018-10-18 MED ORDER — DICYCLOMINE HCL 10 MG PO CAPS
10.0000 mg | ORAL_CAPSULE | Freq: Once | ORAL | Status: AC
  Administered 2018-10-18: 10 mg via ORAL
  Filled 2018-10-18: qty 1

## 2018-10-18 MED ORDER — SUCRALFATE 1 G PO TABS: 1.0000 g | ORAL_TABLET | Freq: Three times a day (TID) | ORAL | 0 refills | Status: AC

## 2018-10-18 MED ORDER — ONDANSETRON 4 MG PO TBDP: 4.0000 mg | ORAL_TABLET | Freq: Three times a day (TID) | ORAL | 0 refills | Status: DC | PRN

## 2018-10-18 MED ORDER — DROPERIDOL 2.5 MG/ML IJ SOLN
INTRAMUSCULAR | Status: AC
  Filled 2018-10-18: qty 2

## 2018-10-18 MED ORDER — LIDOCAINE VISCOUS HCL 2 % MT SOLN
15.0000 mL | Freq: Once | OROMUCOSAL | Status: AC
  Administered 2018-10-18: 14:00:00 15 mL via ORAL
  Filled 2018-10-18: qty 15

## 2018-10-18 MED ORDER — SUCRALFATE 1 G PO TABS: 1.0000 g | ORAL_TABLET | Freq: Three times a day (TID) | ORAL | 0 refills | Status: DC

## 2018-10-18 MED ORDER — ONDANSETRON HCL 4 MG/2ML IJ SOLN
4.0000 mg | Freq: Once | INTRAMUSCULAR | Status: AC
  Administered 2018-10-18: 4 mg via INTRAVENOUS
  Filled 2018-10-18: qty 2

## 2018-10-18 MED ORDER — FAMOTIDINE 20 MG PO TABS
20.0000 mg | ORAL_TABLET | Freq: Once | ORAL | Status: AC
  Administered 2018-10-18: 20 mg via ORAL
  Filled 2018-10-18: qty 1

## 2018-10-18 MED ORDER — SODIUM CHLORIDE 0.9 % IV BOLUS
1000.0000 mL | Freq: Once | INTRAVENOUS | Status: AC
  Administered 2018-10-18: 1000 mL via INTRAVENOUS

## 2018-10-18 NOTE — ED Provider Notes (Signed)
[facility] EMERGENCY DEPARTMENT Provider Note   CSN: [id number] Arrival date & time: 10/18/18  1239    History   Chief Complaint Chief Complaint  Patient presents with  . Abdominal Pain  . Emesis    HPI Frank Mclaughlin is a 38 y.o. male.     38 year old male who presents with vomiting and abdominal pain.  The patient presented here on 4/5 with vomiting and upper abdominal pain.  His work-up was reassuring and he was discharged home after he was feeling better.  He states that he began vomiting as soon as he got home but then the next morning he felt better so he never filled the prescription for Zofran that he was given.  This morning he woke up and felt bad again.  He began having the same upper abdominal pain and intermittent vomiting episodes.  He does admit that he smoked some weed this morning to try to make himself feel better.  He uses marijuana use regularly but has not been using it as much since he has been sick.  He does report that he drinks a moderate amount of alcohol regularly although not since he's been sick.  He denies any lower abdominal pain, diarrhea, fever, URI symptoms, sick contacts, or recent travel.  He has not had a bowel movement in a few days because he has not been able to eat.  He did eat a tangerine this morning.  The history is provided by the patient.  Abdominal Pain  Associated symptoms: vomiting   Emesis  Associated symptoms: abdominal pain     History reviewed. No pertinent past medical history.  Patient Active Problem List   Diagnosis Date Noted  . Hidradenitis 04/24/2011    History reviewed. No pertinent surgical history.      Home Medications    Prior to Admission medications   Medication Sig Start Date End Date Taking? Authorizing Provider  lidocaine (XYLOCAINE) 2 % solution Use as directed 15 mLs in the mouth or throat every 3 (three) hours as needed for mouth pain. 10/13/13   Gerhard Munch, MD  naproxen sodium  (ANAPROX) 220 MG tablet Take 440 mg by mouth 2 (two) times daily as needed. For pain     [provider]  neomycin-bacitracin-polymyxin (NEOSPORIN) ointment Apply topically daily. apply to eye     [provider]  omeprazole (PRILOSEC) 20 MG capsule Take 1 capsule (20 mg total) by mouth daily. 10/18/18   Little, Ambrose Finland, MD  ondansetron (ZOFRAN ODT) 4 MG disintegrating tablet Take 1 tablet (4 mg total) by mouth every 8 (eight) hours as needed for nausea or vomiting. 10/18/18   Little, Ambrose Finland, MD  pantoprazole (PROTONIX) 20 MG tablet Take 1 tablet (20 mg total) by mouth daily. 07/17/18   Rolan Bucco, MD  sucralfate (CARAFATE) 1 g tablet Take 1 tablet (1 g total) by mouth 4 (four) times daily -  with meals and at bedtime for 7 days. 10/18/18 10/25/18  Little, Ambrose Finland, MD    Family History No family history on file.  Social History Social History   Tobacco Use  . Smoking status: Current Every Day Smoker    Packs/day: 0.50  . Smokeless tobacco: Never Used  Substance Use Topics  . Alcohol use: Yes    Comment: occ  . Drug use: Yes    Comment: occasional -marijuana     Allergies   Patient has no known allergies.   Review of Systems Review of Systems  Gastrointestinal: Positive for abdominal pain and vomiting.   All other systems reviewed and are negative except that which was mentioned in HPI   Physical Exam Updated Vital Signs BP (!) 156/109   Pulse 86   Temp 98.1 F (36.7 C)   Resp 16   Ht 5\' 10"  (1.778 m)   Wt 106.5 kg   SpO2 100%   BMI 33.69 kg/m   Physical Exam Vitals signs and nursing note reviewed.  Constitutional:      General: He is not in acute distress.    Appearance: He is well-developed.     Comments: uncomfortable  HENT:     Head: Normocephalic and atraumatic.     Mouth/Throat:     Comments: Dry mouth Eyes:     Conjunctiva/sclera: Conjunctivae normal.  Neck:     Musculoskeletal: Neck supple.  Cardiovascular:      Rate and Rhythm: Normal rate and regular rhythm.     Heart sounds: Normal heart sounds. No murmur.  Pulmonary:     Effort: Pulmonary effort is normal.     Breath sounds: Normal breath sounds.  Abdominal:     General: Bowel sounds are normal. There is no distension.     Palpations: Abdomen is soft.     Tenderness: There is abdominal tenderness in the left upper quadrant. There is no guarding or rebound. Negative signs include Murphy's sign.  Skin:    General: Skin is warm and dry.  Neurological:     Mental Status: He is alert and oriented to person, place, and time.     Comments: Fluent speech  Psychiatric:        Judgment: Judgment normal.      ED Treatments / Results  Labs (all labs ordered are listed, but only abnormal results are displayed) Labs Reviewed  COMPREHENSIVE METABOLIC PANEL - Abnormal; Notable for the following components:      Result Value   Sodium 134 (*)    Glucose, Bld 297 (*)    AST 14 (*)    All other components within normal limits  CBC WITH DIFFERENTIAL/PLATELET - Abnormal; Notable for the following components:   Hemoglobin 17.7 (*)    All other components within normal limits  RAPID URINE DRUG SCREEN, HOSP PERFORMED - Abnormal; Notable for the following components:   Tetrahydrocannabinol POSITIVE (*)    All other components within normal limits  LIPASE, BLOOD  LACTIC ACID, PLASMA  LACTIC ACID, PLASMA    EKG None  Radiology No results found.  Procedures Procedures (including critical care time)  Medications Ordered in ED Medications  droperidol (INAPSINE) 2.5 MG/ML injection 2.5 mg (has no administration in time range)  ondansetron (ZOFRAN) injection 4 mg (4 mg Intravenous Given 10/18/18 1325)  sodium chloride 0.9 % bolus 1,000 mL (1,000 mLs Intravenous New Bag/Given 10/18/18 1322)  alum & mag hydroxide-simeth (MAALOX/MYLANTA) 200-200-20 MG/5ML suspension 30 mL (30 mLs Oral Given 10/18/18 1414)    And  lidocaine (XYLOCAINE) 2 % viscous mouth  solution 15 mL (15 mLs Oral Given 10/18/18 1416)  dicyclomine (BENTYL) capsule 10 mg (10 mg Oral Given 10/18/18 1414)  famotidine (PEPCID) tablet 20 mg (20 mg Oral Given 10/18/18 1415)     Initial Impression / Assessment and Plan / ED Course  I have reviewed the triage vital signs and the nursing notes.  Pertinent labs that were available during my care of the patient were reviewed by me and considered in my medical decision making (see chart for details).  Non-toxic on exam, hypertensive but otherwise reassuring VS. LUQ tenderness with no lower abd tenderness. Given location of pain and duration of symptoms, appendicitis, bowel obstruction, or diverticulitis seems very unlikely and I do not feel he needs CT scan. DDx includes gastritis, PUD, cyclical vomiting syndrome from marijuana. Labwork shows normal LFTs and lipase, normal WBC, normal lactate. Glucose 297, I noted elevated BG on previous labs. I discussed this finding with him and need for PCP f/u so that he can be screened for diabetes when he is well again.   Counseled on marijuana and alcohol cessation. Recommended trial of PPI with carafate and counseled on low acid diet.   On reassessment, he is trying to drink gingerale but is still nauseated. I have ordered droperidol for nausea. Signed out to oncoming provider pending improvement. I anticipate discharge after PO challenge. Final Clinical Impressions(s) / ED Diagnoses   Final diagnoses:  Hyperglycemia  Pain of upper abdomen  Non-intractable vomiting with nausea, unspecified vomiting type    ED Discharge Orders         Ordered    omeprazole (PRILOSEC) 20 MG capsule  Daily     10/18/18 1509    ondansetron (ZOFRAN ODT) 4 MG disintegrating tablet  Every 8 hours PRN     10/18/18 1509    sucralfate (CARAFATE) 1 g tablet  3 times daily with meals & bedtime     10/18/18 1509           Little, Ambrose Finlandachel Morgan, MD 10/18/18 1511

## 2018-10-18 NOTE — Discharge Instructions (Addendum)

## 2018-10-18 NOTE — ED Notes (Signed)
Assumed care at 1515, no emesis. Tolerating po ginger ale.

## 2018-10-18 NOTE — ED Provider Notes (Signed)
Blood pressure (!) 160/100, pulse 78, temperature 98.1 F (36.7 C), resp. rate 18, height 5\' 10"  (1.778 m), weight 106.5 kg, SpO2 100 %.  Assuming care from Dr. Clarene Duke.  In short, Frank Mclaughlin is a 38 y.o. male with a chief complaint of Abdominal Pain and Emesis .  Refer to the original H&P for additional details.  The current plan of care is to f/u after PO challenge.  04:00 PM  He is feeling much better after droperidol.  His wife is in route to pick him up.  Prescriptions sent to pharmacy by prior EDP.  Discussed ED return precautions.    Maia Plan, MD 10/18/18 [phone]

## 2018-10-18 NOTE — ED Triage Notes (Addendum)
Recheck vomiting and abdominal. He was diagnosed with "a bug". States he never got his Rx filled. No better.

## 2018-10-18 NOTE — ED Notes (Signed)
Dr Jacqulyn Bath does not want 2nd lactic acid. Pt awaiting discharge.

## 2019-02-01 ENCOUNTER — Encounter (HOSPITAL_BASED_OUTPATIENT_CLINIC_OR_DEPARTMENT_OTHER): Payer: Self-pay

## 2019-02-01 ENCOUNTER — Other Ambulatory Visit: Payer: Self-pay

## 2019-02-01 ENCOUNTER — Emergency Department (HOSPITAL_BASED_OUTPATIENT_CLINIC_OR_DEPARTMENT_OTHER)
Admission: EM | Admit: 2019-02-01 | Discharge: 2019-02-01 | Disposition: A | Discharge status: Home / Self Care | Payer: Self-pay | Attending: Emergency Medicine | Admitting: Emergency Medicine

## 2019-02-01 DIAGNOSIS — F17210 Nicotine dependence, cigarettes, uncomplicated: Secondary | ICD-10-CM | POA: Insufficient documentation

## 2019-02-01 DIAGNOSIS — K529 Noninfective gastroenteritis and colitis, unspecified: Secondary | ICD-10-CM | POA: Insufficient documentation

## 2019-02-01 DIAGNOSIS — R112 Nausea with vomiting, unspecified: Secondary | ICD-10-CM

## 2019-02-01 LAB — COMPREHENSIVE METABOLIC PANEL
ALT: 22 U/L (ref 0–44)
AST: 17 U/L (ref 15–41)
Albumin: 4.4 g/dL (ref 3.5–5.0)
Alkaline Phosphatase: 74 U/L (ref 38–126)
Anion gap: 16 — ABNORMAL HIGH (ref 5–15)
BUN: 10 mg/dL (ref 6–20)
CO2: 24 mmol/L (ref 22–32)
Calcium: 10.1 mg/dL (ref 8.9–10.3)
Chloride: 93 mmol/L — ABNORMAL LOW (ref 98–111)
Creatinine, Ser: 0.93 mg/dL (ref 0.61–1.24)
GFR calc Af Amer: >60 mL/min (ref >60)
GFR calc non Af Amer: >60 mL/min (ref >60)
Glucose, Bld: 184 mg/dL — ABNORMAL HIGH (ref 70–99)
Potassium: 3.4 mmol/L — ABNORMAL LOW (ref 3.5–5.1)
Sodium: 133 mmol/L — ABNORMAL LOW (ref 135–145)
Total Bilirubin: 1 mg/dL (ref 0.3–1.2)
Total Protein: 8 g/dL (ref 6.5–8.1)

## 2019-02-01 LAB — CBC WITH DIFFERENTIAL/PLATELET
Abs Immature Granulocytes: 0.01 10*3/uL (ref 0.00–0.07)
Basophils Absolute: 0.1 10*3/uL (ref 0.0–0.1)
Basophils Relative: 1 %
Eosinophils Absolute: 0 10*3/uL (ref 0.0–0.5)
Eosinophils Relative: 0 %
HCT: 51.4 % (ref 39.0–52.0)
Hemoglobin: 18.4 g/dL — ABNORMAL HIGH (ref 13.0–17.0)
Immature Granulocytes: 0 %
Lymphocytes Relative: 31 %
Lymphs Abs: 2.9 10*3/uL (ref 0.7–4.0)
MCH: 31.6 pg (ref 26.0–34.0)
MCHC: 35.8 g/dL (ref 30.0–36.0)
MCV: 88.2 fL (ref 80.0–100.0)
Monocytes Absolute: 0.8 10*3/uL (ref 0.1–1.0)
Monocytes Relative: 9 %
Neutro Abs: 5.4 10*3/uL (ref 1.7–7.7)
Neutrophils Relative %: 59 %
Platelets: 311 10*3/uL (ref 150–400)
RBC: 5.83 MIL/uL — ABNORMAL HIGH (ref 4.22–5.81)
RDW: 11.7 % (ref 11.5–15.5)
WBC: 9.1 10*3/uL (ref 4.0–10.5)
nRBC: 0 % (ref 0.0–0.2)

## 2019-02-01 LAB — LIPASE, BLOOD: Lipase: 31 U/L (ref 11–51)

## 2019-02-01 MED ORDER — SODIUM CHLORIDE 0.9 % IV BOLUS
1000.0000 mL | Freq: Once | INTRAVENOUS | Status: AC
  Administered 2019-02-01: 17:00:00 1000 mL via INTRAVENOUS

## 2019-02-01 MED ORDER — ALUM & MAG HYDROXIDE-SIMETH 200-200-20 MG/5ML PO SUSP
30.0000 mL | Freq: Once | ORAL | Status: AC
  Administered 2019-02-01: 19:00:00 30 mL via ORAL
  Filled 2019-02-01: qty 30

## 2019-02-01 MED ORDER — LIDOCAINE VISCOUS HCL 2 % MT SOLN
15.0000 mL | Freq: Once | OROMUCOSAL | Status: AC
  Administered 2019-02-01: 19:00:00 15 mL via ORAL
  Filled 2019-02-01: qty 15

## 2019-02-01 MED ORDER — ONDANSETRON HCL 4 MG/2ML IJ SOLN
4.0000 mg | Freq: Once | INTRAMUSCULAR | Status: AC
  Administered 2019-02-01: 4 mg via INTRAVENOUS
  Filled 2019-02-01: qty 2

## 2019-02-01 MED ORDER — OMEPRAZOLE 20 MG PO CPDR: 20.0000 mg | DELAYED_RELEASE_CAPSULE | Freq: Two times a day (BID) | ORAL | 0 refills | Status: AC

## 2019-02-01 MED ORDER — KETOROLAC TROMETHAMINE 30 MG/ML IJ SOLN
30.0000 mg | Freq: Once | INTRAMUSCULAR | Status: AC
  Administered 2019-02-01: 17:00:00 30 mg via INTRAVENOUS
  Filled 2019-02-01: qty 1

## 2019-02-01 MED ORDER — ONDANSETRON 8 MG PO TBDP: ORAL_TABLET | ORAL | 0 refills | Status: DC

## 2019-02-01 NOTE — ED Provider Notes (Signed)
[facility] EMERGENCY DEPARTMENT Provider Note   CSN: [id number] Arrival date & time: 02/01/19  1637     History   Chief Complaint Chief Complaint  Patient presents with  . Emesis    HPI Frank Mclaughlin is a 38 y.o. male.     Patient is a 38 year old male with no significant past medical history.  He presents today with complaints of nausea and vomiting.  This started yesterday and has been persistent.  He states he has been unable to keep anything down.  He denies any abdominal pain.  He denies any diarrhea.  He denies any bloody stool or vomit.  He last urinated approximately 30 minutes ago.  He does report a history of acid reflux, however is not on any medication for prevention of this.  The history is provided by the patient.  Emesis Severity:  Moderate Duration:  2 days Timing:  Constant Quality:  Stomach contents Progression:  Worsening Chronicity:  New Relieved by:  Nothing Worsened by:  Nothing Ineffective treatments:  None tried Associated symptoms: no abdominal pain and no fever     History reviewed. No pertinent past medical history.  Patient Active Problem List   Diagnosis Date Noted  . Hidradenitis 04/24/2011    History reviewed. No pertinent surgical history.      Home Medications    Prior to Admission medications   Medication Sig Start Date End Date Taking? Authorizing Provider  lidocaine (XYLOCAINE) 2 % solution Use as directed 15 mLs in the mouth or throat every 3 (three) hours as needed for mouth pain. 10/13/13   Carmin Muskrat, MD  naproxen sodium (ANAPROX) 220 MG tablet Take 440 mg by mouth 2 (two) times daily as needed. For pain     [provider]  neomycin-bacitracin-polymyxin (NEOSPORIN) ointment Apply topically daily. apply to eye     [provider]  omeprazole (PRILOSEC) 20 MG capsule Take 1 capsule (20 mg total) by mouth daily. 10/18/18   Long, Wonda Olds, MD  ondansetron (ZOFRAN ODT) 4 MG disintegrating  tablet Take 1 tablet (4 mg total) by mouth every 8 (eight) hours as needed for nausea or vomiting. 10/18/18   Long, Wonda Olds, MD  pantoprazole (PROTONIX) 20 MG tablet Take 1 tablet (20 mg total) by mouth daily. 07/17/18   Malvin Johns, MD  sucralfate (CARAFATE) 1 g tablet Take 1 tablet (1 g total) by mouth 4 (four) times daily -  with meals and at bedtime for 7 days. 10/18/18 10/25/18  Long, Wonda Olds, MD    Family History No family history on file.  Social History Social History   Tobacco Use  . Smoking status: Current Every Day Smoker    Packs/day: 0.50  . Smokeless tobacco: Never Used  Substance Use Topics  . Alcohol use: Not Currently  . Drug use: Not Currently    Types: Marijuana     Allergies   Patient has no known allergies.   Review of Systems Review of Systems  Constitutional: Negative for fever.  Gastrointestinal: Positive for vomiting. Negative for abdominal pain.  All other systems reviewed and are negative.    Physical Exam Updated Vital Signs BP (!) 147/107 (BP Location: Left Arm)   Pulse (!) 117   Temp 98.3 F (36.8 C) (Oral)   Resp 20   Ht 5\' 8"  (1.727 m)   Wt 89.4 kg   SpO2 100%   BMI 29.95 kg/m   Physical Exam Vitals signs and nursing note reviewed.  Constitutional:      General: He is not in acute distress.    Appearance: He is well-developed. He is not diaphoretic.  HENT:     Head: Normocephalic and atraumatic.  Neck:     Musculoskeletal: Normal range of motion and neck supple.  Cardiovascular:     Rate and Rhythm: Normal rate and regular rhythm.     Heart sounds: No murmur. No friction rub.  Pulmonary:     Effort: Pulmonary effort is normal. No respiratory distress.     Breath sounds: Normal breath sounds. No wheezing or rales.  Abdominal:     General: Bowel sounds are normal. There is no distension.     Palpations: Abdomen is soft.     Tenderness: There is no abdominal tenderness.  Musculoskeletal: Normal range of motion.  Skin:     General: Skin is warm and dry.  Neurological:     Mental Status: He is alert and oriented to person, place, and time.     Coordination: Coordination normal.      ED Treatments / Results  Labs (all labs ordered are listed, but only abnormal results are displayed) Labs Reviewed  COMPREHENSIVE METABOLIC PANEL  LIPASE, BLOOD  CBC WITH DIFFERENTIAL/PLATELET    EKG None  Radiology No results found.  Procedures Procedures (including critical care time)  Medications Ordered in ED Medications  ondansetron (ZOFRAN) injection 4 mg (has no administration in time range)  sodium chloride 0.9 % bolus 1,000 mL (has no administration in time range)  ketorolac (TORADOL) 30 MG/ML injection 30 mg (has no administration in time range)     Initial Impression / Assessment and Plan / ED Course  I have reviewed the triage vital signs and the nursing notes.  Pertinent labs & imaging results that were available during my care of the patient were reviewed by me and considered in my medical decision making (see chart for details).  Patient presenting with nausea and vomiting, the etiology of which is likely viral.  Patient feeling better after IV fluids and medications.  Laboratory studies are reassuring and his abdominal exam is benign.  At this point, I feel as though he is appropriate for discharge.  He will be given Zofran and advised to follow a clear liquid diet.  To return as needed if his symptoms worsen or change.  Final Clinical Impressions(s) / ED Diagnoses   Final diagnoses:  None    ED Discharge Orders    None       Geoffery Lyonselo, Alexi Geibel, MD 02/01/19 [phone]

## 2019-02-01 NOTE — ED Triage Notes (Signed)
C/o n/v x 3 days-NAD-steady gait

## 2019-02-01 NOTE — ED Notes (Signed)
ED Provider at bedside. 

## 2019-02-01 NOTE — Discharge Instructions (Addendum)
Zofran as prescribed as needed for nausea.  Begin taking Prilosec twice daily for the next 2 weeks.  Follow-up with your primary doctor if symptoms not improving in the next few days, and return to the ER if symptoms significantly worsen or change.  The contact information for [facility] gastroenterology has been provided in this discharge summary for you to call and make an appointment.

## 2019-03-01 ENCOUNTER — Encounter (HOSPITAL_BASED_OUTPATIENT_CLINIC_OR_DEPARTMENT_OTHER): Payer: Self-pay

## 2019-03-01 ENCOUNTER — Other Ambulatory Visit: Payer: Self-pay

## 2019-03-01 ENCOUNTER — Emergency Department (HOSPITAL_BASED_OUTPATIENT_CLINIC_OR_DEPARTMENT_OTHER)
Admission: EM | Admit: 2019-03-01 | Discharge: 2019-03-01 | Disposition: A | Discharge status: Home / Self Care | Payer: Self-pay | Attending: Emergency Medicine | Admitting: Emergency Medicine

## 2019-03-01 DIAGNOSIS — K610 Anal abscess: Secondary | ICD-10-CM | POA: Insufficient documentation

## 2019-03-01 DIAGNOSIS — F17210 Nicotine dependence, cigarettes, uncomplicated: Secondary | ICD-10-CM | POA: Insufficient documentation

## 2019-03-01 DIAGNOSIS — K644 Residual hemorrhoidal skin tags: Secondary | ICD-10-CM | POA: Insufficient documentation

## 2019-03-01 DIAGNOSIS — Z79899 Other long term (current) drug therapy: Secondary | ICD-10-CM | POA: Insufficient documentation

## 2019-03-01 MED ORDER — OXYCODONE HCL 5 MG PO TABS: 5.0000 mg | ORAL_TABLET | Freq: Four times a day (QID) | ORAL | 0 refills | Status: AC | PRN

## 2019-03-01 MED ORDER — AMOXICILLIN-POT CLAVULANATE 875-125 MG PO TABS: 1.0000 | ORAL_TABLET | Freq: Two times a day (BID) | ORAL | 0 refills | Status: AC

## 2019-03-01 MED ORDER — OXYCODONE-ACETAMINOPHEN 5-325 MG PO TABS
1.0000 | ORAL_TABLET | Freq: Once | ORAL | Status: AC
  Administered 2019-03-01: 20:00:00 1 via ORAL
  Filled 2019-03-01: qty 1

## 2019-03-01 MED ORDER — LIDOCAINE-EPINEPHRINE (PF) 2 %-1:200000 IJ SOLN
10.0000 mL | Freq: Once | INTRAMUSCULAR | Status: AC
  Administered 2019-03-01: 10 mL
  Filled 2019-03-01 (×2): qty 10

## 2019-03-01 MED ORDER — ACETAMINOPHEN 325 MG PO TABS
650.0000 mg | ORAL_TABLET | Freq: Once | ORAL | Status: AC
  Administered 2019-03-01: 650 mg via ORAL
  Filled 2019-03-01: qty 2

## 2019-03-01 NOTE — Discharge Instructions (Addendum)
You have a small perianal abscess (boil). This is a collection of pus.  You also have a small non tender external hemorrhoid right below it.  Treatment of abscesses includes antibiotics, anti-inflammatories and moist heat therapy and massage   Take antibiotic as prescribed and until completed. Symptoms typically improve in 48-72 hours.   You need re-evaluation of wound in 72 hours to ensure it is healing well and there is no recollection.  You can contact general surgery clinic for follow up in 3 days. If unable to be seen in clinic you may go to urgent care or return to ER   Start doing warm sitz baths and massage under warm water as often as possible to help drainage.   For pain and inflammation you can use a combination of ibuprofen and acetaminophen.  Take [phone] mg acetaminophen (tylenol) every 6 hours or 600 mg ibuprofen (advil, motrin) every 6 hours.  You can take these separately or combine them every 6 hours for maximum pain control. Do not exceed 4,000 mg acetaminophen or 2,400 mg ibuprofen in a 24 hour period.  Do not take ibuprofen containing products if you have history of kidney disease, ulcers, GI bleeding, severe acid reflux, or take a blood thinner.  Do not take acetaminophen if you have liver disease.   For break through and/or severe pain despite ibuprofen and acetaminophen regimen, take 5 mg oxycodone every 6 hours.  Oxycodone is a narcotic pain medication that has risk of overdose, death, dependence and abuse. Mild and expected side effects include nausea, stomach upset, drowsiness, constipation. Do not consume alcohol, drive or use heavy machinery while taking this medication. Do not leave unattended around children. Flush any remaining pills that you do not use and do not share.  The emergency department has a strict policy regarding prescription of narcotic medications.   Any abscess can worsen, enlarge and spread infection into blood stream.  Return to the ER if you have  fevers, chills, worsening swelling, redness, warmth.

## 2019-03-01 NOTE — ED Provider Notes (Signed)
[facility] EMERGENCY DEPARTMENT Provider Note   CSN: [id number] Arrival date & time: 03/01/19  1849    History   Chief Complaint Chief Complaint  Patient presents with  . Rectal Pain    HPI Frank Mclaughlin is a 38 y.o. male with history of diabetes on metformin presents to the ER for evaluation of anal pain.  Onset 2 days ago.  He thinks that he has hemorrhoid that has prolapsed.  He feels a bulge there that is tender.  He tried to push it in but it was too painful.  He put Neosporin on it and had to a leftover amoxicillin that he took without relief.  States for the last 3 to 4 weeks he has had constipation, harder bowel movements and some generalized anal soreness after bowel movements with hematochezia.  He was followed up by PCP and was told that he had diabetes and started on metformin.  States that his bowel movements have normalized.  His last BM was earlier today and he had pain around the anus with it and with wiping.  He denies any recent hematochezia.  He denies any fever, blood or pus from the anus, diarrhea. His pain is worse with sitting, moving, palpation.  No alleviating factors.  The pain is moderate to severe.  HPI  History reviewed. No pertinent past medical history.  Patient Active Problem List   Diagnosis Date Noted  . Hidradenitis 04/24/2011    History reviewed. No pertinent surgical history.      Home Medications    Prior to Admission medications   Medication Sig Start Date End Date Taking? Authorizing Provider  amoxicillin-clavulanate (AUGMENTIN) 875-125 MG tablet Take 1 tablet by mouth every 12 (twelve) hours. 03/01/19   Liberty HandyGibbons, Paislyn Domenico J, PA-C  lidocaine (XYLOCAINE) 2 % solution Use as directed 15 mLs in the mouth or throat every 3 (three) hours as needed for mouth pain. 10/13/13   Gerhard MunchLockwood, Robert, MD  naproxen sodium (ANAPROX) 220 MG tablet Take 440 mg by mouth 2 (two) times daily as needed. For pain     [provider]   neomycin-bacitracin-polymyxin (NEOSPORIN) ointment Apply topically daily. apply to eye     [provider]  omeprazole (PRILOSEC) 20 MG capsule Take 1 capsule (20 mg total) by mouth 2 (two) times daily before a meal. 02/01/19   Geoffery Lyonselo, Douglas, MD  ondansetron (ZOFRAN ODT) 8 MG disintegrating tablet 8mg  ODT q4 hours prn nausea 02/01/19   Geoffery Lyonselo, Douglas, MD  oxyCODONE (OXY IR/ROXICODONE) 5 MG immediate release tablet Take 1 tablet (5 mg total) by mouth every 6 (six) hours as needed for up to 1 day for severe pain. 03/01/19 03/02/19  Liberty HandyGibbons, Lorna Strother J, PA-C  pantoprazole (PROTONIX) 20 MG tablet Take 1 tablet (20 mg total) by mouth daily. 07/17/18   Rolan BuccoBelfi, Melanie, MD  sucralfate (CARAFATE) 1 g tablet Take 1 tablet (1 g total) by mouth 4 (four) times daily -  with meals and at bedtime for 7 days. 10/18/18 10/25/18  Long, Arlyss RepressJoshua G, MD    Family History No family history on file.  Social History Social History   Tobacco Use  . Smoking status: Current Every Day Smoker    Packs/day: 0.50  . Smokeless tobacco: Never Used  Substance Use Topics  . Alcohol use: Not Currently  . Drug use: Not Currently    Types: Marijuana     Allergies   Patient has no known allergies.   Review of Systems Review of Systems  Genitourinary:       Perianal pain, bulging  All other systems reviewed and are negative.    Physical Exam Updated Vital Signs BP 137/64 (BP Location: Right Arm)   Pulse 78   Temp 98.1 F (36.7 C) (Oral)   Resp 18   Ht 5\' 8"  (1.727 m)   Wt 86.6 kg   SpO2 99%   BMI 29.04 kg/m   Physical Exam Vitals signs and nursing note reviewed.  Constitutional:      Appearance: He is well-developed.     Comments: Non toxic.  HENT:     Head: Normocephalic and atraumatic.     Nose: Nose normal.  Eyes:     Conjunctiva/sclera: Conjunctivae normal.  Neck:     Musculoskeletal: Normal range of motion.  Cardiovascular:     Rate and Rhythm: Normal rate and regular rhythm.     Heart  sounds: Normal heart sounds.  Pulmonary:     Effort: Pulmonary effort is normal.     Breath sounds: Normal breath sounds.  Abdominal:     General: Bowel sounds are normal.     Palpations: Abdomen is soft.     Tenderness: There is no abdominal tenderness.  Genitourinary:      Comments:  Exam performed with EMT at bedside.  There a non tender non thrombosed external hemorrhoid at around 3 o'clock.  There is focal area of erythema, fluctuance, exquisite tenderness at about 1:00, just above external hemorrhoid.  There was purulent drainage expressed with mild pressure, exquisite tenderness.  This area measures approximately 2 cm x 1 cm and is right outside the anal verge. No surrounding streaking of erythema, warmth, crepitus.  Internal exam/DR E performed, patient had no significant tenderness and internal exam.  I was able to palpate 2 to 3 cm of the rectal wall and there was no significant edema, fluctuance, tenderness.  Good rectal tone.  Stool was brown. Musculoskeletal: Normal range of motion.  Skin:    General: Skin is warm and dry.     Capillary Refill: Capillary refill takes less than 2 seconds.  Neurological:     Mental Status: He is alert.  Psychiatric:        Behavior: Behavior normal.      ED Treatments / Results  Labs (all labs ordered are listed, but only abnormal results are displayed) Labs Reviewed - No data to display  EKG None  Radiology No results found.  Procedures .Marland KitchenIncision and Drainage  Date/Time: 03/01/2019 8:41 PM Performed by: Kinnie Feil, PA-C Authorized by: Kinnie Feil, PA-C   Consent:    Consent obtained:  Verbal   Consent given by:  Patient   Risks discussed:  Bleeding, incomplete drainage, pain and damage to other organs   Alternatives discussed:  Alternative treatment Universal protocol:    Procedure explained and questions answered to patient or proxy's satisfaction: yes     Relevant documents present and verified: yes      Required blood products, implants, devices, and special equipment available: yes     Site/side marked: yes     Immediately prior to procedure a time out was called: yes     Patient identity confirmed:  Verbally with patient Location:    Type:  Abscess   Size:  3 x 2   Location:  Anogenital   Anogenital location:  Perianal Pre-procedure details:    Procedure prep: alcohol wipe. Anesthesia (see MAR for exact dosages):    Anesthesia method:  Local infiltration  Local anesthetic:  Lidocaine 2% WITH epi Procedure type:    Complexity:  Complex Procedure details:    Incision types:  Single straight   Incision depth:  Subcutaneous   Scalpel blade:  11   Wound management:  Probed and deloculated and irrigated with saline   Drainage:  Purulent   Drainage amount:  Copious   Packing materials:  None (abscess cavity was small, area was difficult to keep dry during procedure and packing unable to be placed) Post-procedure details:    Patient tolerance of procedure:  Tolerated well, no immediate complications   (including critical care time)  Medications Ordered in ED Medications  lidocaine-EPINEPHrine (XYLOCAINE W/EPI) 2 %-1:200000 (PF) injection 10 mL (10 mLs Infiltration Given 03/01/19 2002)  oxyCODONE-acetaminophen (PERCOCET/ROXICET) 5-325 MG per tablet 1 tablet (1 tablet Oral Given 03/01/19 2001)  acetaminophen (TYLENOL) tablet 650 mg (650 mg Oral Given 03/01/19 2002)     Initial Impression / Assessment and Plan / ED Course  I have reviewed the triage vital signs and the nursing notes.  Pertinent labs & imaging results that were available during my care of the patient were reviewed by me and considered in my medical decision making (see chart for details).   Exam reveals non thrombosed, non tender uncomplicated external hemorrhoid and nearby small perianal abscess that is spontaneously draining. DRE performed and pt had no internal tenderness, fullness, fluctuance to suggest deeper  rectal spread of abscess.  No constitutional symptoms, abdominal or pelvic pain to suggest deeper pelvic abscess or systemic infection. No indication for emergent intervention of external hemorrhoid. I&D of perianal abscess done today with copious amounts of purulence drainage and complete resolution of pain, edema.  Cavity of abscess irrigated with saline. Unable to pack due to location of abscess.  Will dc with warm sitz baths/massage, augmentin, high dose NSAIDs. Gave 4 oxycodone to use only as needed for severe pain, pt understands this puts him at risk for recurrent constipation which would make everything worse. He states he will only take it as needed and prioritize NSAIDs.  Recommended general surgery f/u in 72 hours ideally, if unable to be seen in clinic to return to UC/ER for wound check. He has h/o diabetes but no fever, chills or signs to suggest systemic infection but is at higher risk for delayed infection healing and worsening infection. He understands this and s/s to monitor for that would indicate immediately return to ER.   Final Clinical Impressions(s) / ED Diagnoses   Final diagnoses:  Perianal abscess  External hemorrhoid    ED Discharge Orders         Ordered    amoxicillin-clavulanate (AUGMENTIN) 875-125 MG tablet  Every 12 hours     03/01/19 2120    oxyCODONE (OXY IR/ROXICODONE) 5 MG immediate release tablet  Every 6 hours PRN     03/01/19 2120           Liberty HandyGibbons, Symantha Steeber J, PA-C 03/02/19 0042    Terrilee FilesButler, Michael C, MD 03/02/19 1005

## 2019-03-01 NOTE — ED Notes (Signed)
I&D tray + lidocaine at bedside.

## 2019-03-01 NOTE — ED Triage Notes (Addendum)
Pt with recent hx of constipation x 2-4 weeks-pt now c/o "large bump to rectum"-constipation resolved-NAD-steady gait

## 2019-04-02 ENCOUNTER — Emergency Department (HOSPITAL_BASED_OUTPATIENT_CLINIC_OR_DEPARTMENT_OTHER)
Admission: EM | Admit: 2019-04-02 | Discharge: 2019-04-02 | Disposition: A | Discharge status: Home / Self Care | Payer: Self-pay | Attending: Emergency Medicine | Admitting: Emergency Medicine

## 2019-04-02 ENCOUNTER — Other Ambulatory Visit: Payer: Self-pay

## 2019-04-02 ENCOUNTER — Encounter (HOSPITAL_BASED_OUTPATIENT_CLINIC_OR_DEPARTMENT_OTHER): Payer: Self-pay | Admitting: *Deleted

## 2019-04-02 DIAGNOSIS — F17210 Nicotine dependence, cigarettes, uncomplicated: Secondary | ICD-10-CM | POA: Insufficient documentation

## 2019-04-02 DIAGNOSIS — Z8711 Personal history of peptic ulcer disease: Secondary | ICD-10-CM | POA: Insufficient documentation

## 2019-04-02 DIAGNOSIS — K2900 Acute gastritis without bleeding: Secondary | ICD-10-CM | POA: Insufficient documentation

## 2019-04-02 DIAGNOSIS — E876 Hypokalemia: Secondary | ICD-10-CM | POA: Insufficient documentation

## 2019-04-02 DIAGNOSIS — R112 Nausea with vomiting, unspecified: Secondary | ICD-10-CM

## 2019-04-02 DIAGNOSIS — Z79899 Other long term (current) drug therapy: Secondary | ICD-10-CM | POA: Insufficient documentation

## 2019-04-02 HISTORY — DX: Gastritis, unspecified, without bleeding: K29.70

## 2019-04-02 LAB — COMPREHENSIVE METABOLIC PANEL
ALT: 15 U/L (ref 0–44)
AST: 14 U/L — ABNORMAL LOW (ref 15–41)
Albumin: 4.4 g/dL (ref 3.5–5.0)
Alkaline Phosphatase: 53 U/L (ref 38–126)
Anion gap: 14 (ref 5–15)
BUN: <5 mg/dL — ABNORMAL LOW (ref 6–20)
CO2: 22 mmol/L (ref 22–32)
Calcium: 9.4 mg/dL (ref 8.9–10.3)
Chloride: 100 mmol/L (ref 98–111)
Creatinine, Ser: 0.8 mg/dL (ref 0.61–1.24)
GFR calc Af Amer: >60 mL/min (ref >60)
GFR calc non Af Amer: >60 mL/min (ref >60)
Glucose, Bld: 164 mg/dL — ABNORMAL HIGH (ref 70–99)
Potassium: 3 mmol/L — ABNORMAL LOW (ref 3.5–5.1)
Sodium: 136 mmol/L (ref 135–145)
Total Bilirubin: 1 mg/dL (ref 0.3–1.2)
Total Protein: 7.5 g/dL (ref 6.5–8.1)

## 2019-04-02 LAB — CBC WITH DIFFERENTIAL/PLATELET
Abs Immature Granulocytes: 0.01 10*3/uL (ref 0.00–0.07)
Basophils Absolute: 0.1 10*3/uL (ref 0.0–0.1)
Basophils Relative: 1 %
Eosinophils Absolute: 0 10*3/uL (ref 0.0–0.5)
Eosinophils Relative: 1 %
HCT: 41.6 % (ref 39.0–52.0)
Hemoglobin: 14.6 g/dL (ref 13.0–17.0)
Immature Granulocytes: 0 %
Lymphocytes Relative: 38 %
Lymphs Abs: 2.2 10*3/uL (ref 0.7–4.0)
MCH: 30.7 pg (ref 26.0–34.0)
MCHC: 35.1 g/dL (ref 30.0–36.0)
MCV: 87.4 fL (ref 80.0–100.0)
Monocytes Absolute: 0.4 10*3/uL (ref 0.1–1.0)
Monocytes Relative: 8 %
Neutro Abs: 3 10*3/uL (ref 1.7–7.7)
Neutrophils Relative %: 52 %
Platelets: 284 10*3/uL (ref 150–400)
RBC: 4.76 MIL/uL (ref 4.22–5.81)
RDW: 12.4 % (ref 11.5–15.5)
WBC: 5.8 10*3/uL (ref 4.0–10.5)
nRBC: 0 % (ref 0.0–0.2)

## 2019-04-02 LAB — URINALYSIS, ROUTINE W REFLEX MICROSCOPIC
Bilirubin Urine: NEGATIVE
Glucose, UA: NEGATIVE mg/dL
Hgb urine dipstick: NEGATIVE
Ketones, ur: NEGATIVE mg/dL
Leukocytes,Ua: NEGATIVE
Nitrite: NEGATIVE
Protein, ur: NEGATIVE mg/dL
Specific Gravity, Urine: 1.01 (ref 1.005–1.030)
pH: 8 (ref 5.0–8.0)

## 2019-04-02 LAB — LIPASE, BLOOD: Lipase: 35 U/L (ref 11–51)

## 2019-04-02 MED ORDER — FENTANYL CITRATE (PF) 100 MCG/2ML IJ SOLN
50.0000 ug | Freq: Once | INTRAMUSCULAR | Status: AC
  Administered 2019-04-02: 50 ug via INTRAVENOUS
  Filled 2019-04-02: qty 2

## 2019-04-02 MED ORDER — SODIUM CHLORIDE 0.9 % IV BOLUS
1000.0000 mL | Freq: Once | INTRAVENOUS | Status: AC
  Administered 2019-04-02: 1000 mL via INTRAVENOUS

## 2019-04-02 MED ORDER — ONDANSETRON HCL 4 MG/2ML IJ SOLN
4.0000 mg | Freq: Once | INTRAMUSCULAR | Status: AC
  Administered 2019-04-02: 15:00:00 4 mg via INTRAVENOUS
  Filled 2019-04-02: qty 2

## 2019-04-02 MED ORDER — POTASSIUM CHLORIDE CRYS ER 20 MEQ PO TBCR: 20.0000 meq | EXTENDED_RELEASE_TABLET | Freq: Two times a day (BID) | ORAL | 0 refills | Status: AC

## 2019-04-02 MED ORDER — PROMETHAZINE HCL 25 MG PO TABS: 25.0000 mg | ORAL_TABLET | Freq: Four times a day (QID) | ORAL | 0 refills | Status: DC | PRN

## 2019-04-02 MED ORDER — OXYCODONE HCL 5 MG PO CAPS: 5.0000 mg | ORAL_CAPSULE | ORAL | 0 refills | Status: AC | PRN

## 2019-04-02 MED ORDER — PROMETHAZINE HCL 25 MG PO TABS: 25.0000 mg | ORAL_TABLET | Freq: Four times a day (QID) | ORAL | 0 refills | Status: AC | PRN

## 2019-04-02 MED ORDER — OXYCODONE HCL 5 MG PO CAPS: 5.0000 mg | ORAL_CAPSULE | ORAL | 0 refills | Status: DC | PRN

## 2019-04-02 MED ORDER — POTASSIUM CHLORIDE CRYS ER 20 MEQ PO TBCR: 20.0000 meq | EXTENDED_RELEASE_TABLET | Freq: Two times a day (BID) | ORAL | 0 refills | Status: DC

## 2019-04-02 MED ORDER — PANTOPRAZOLE SODIUM 40 MG IV SOLR
40.0000 mg | Freq: Once | INTRAVENOUS | Status: AC
  Administered 2019-04-02: 40 mg via INTRAVENOUS
  Filled 2019-04-02: qty 40

## 2019-04-02 NOTE — ED Notes (Signed)
Reminded pt of need for urine sample 

## 2019-04-02 NOTE — ED Provider Notes (Signed)
[facility] EMERGENCY DEPARTMENT Provider Note   CSN: [id number] Arrival date & time: 04/02/19  1433     History   Chief Complaint Chief Complaint  Patient presents with  . Emesis    HPI Ronna PolioJeffrey S Mauck is a 38 y.o. male.     Patient with history of gastritis, peptic ulcer disease presents to the emergency department today with worsening nausea, vomiting, epigastric pain over the past 2 days which he relates to his gastritis.  [place]  Patient has had symptoms with occasional bouts of hematochezia, weight loss over the past 6 months.  Patient had endoscopy several weeks ago.  He was found to have gastritis, possible "healing ulcers", H. pylori on biopsy (per Care Everywhere).  Patient is currently on antibiotics for H. pylori.  He states that he is having difficulty holding down solid foods at the current time.  He has a burning pain up into his chest which she relates to reflux.  He is currently taking PPI.  Denies any lower abdominal pain.  No shortness of breath or fevers.  Symptoms are consistent with previous episodes of gastritis.  The onset of this condition was acute. The course is constant. Aggravating factors: none. Alleviating factors: none.    EGD note from care everywhere (9/20):  IMPRESSIONS: NORMAL ESOPHAGUS. Z LINE REGULAR. ERYTHEMATOUS, ERODED  AND PRETECHNIAL MUCOSA IN ANTRUM. POSSIBLY RETAINED ULCER. NORMAL  DUODENAL BULB, FIRST PORTION OF THE DUODENUM AND SECOND PORTION OF  DUODENUM. THE EXAMINATION WAS OTHERWISE NORMAL.      Past Medical History:  Diagnosis Date  . Gastritis     Patient Active Problem List   Diagnosis Date Noted  . Hidradenitis 04/24/2011    History reviewed. No pertinent surgical history.      Home Medications    Prior to Admission medications   Medication Sig Start Date End Date Taking? Authorizing Provider  amoxicillin-clavulanate (AUGMENTIN) 875-125 MG tablet Take 1 tablet  by mouth every 12 (twelve) hours. 03/01/19   Liberty HandyGibbons, Claudia J, PA-C  lidocaine (XYLOCAINE) 2 % solution Use as directed 15 mLs in the mouth or throat every 3 (three) hours as needed for mouth pain. 10/13/13   Gerhard MunchLockwood, Robert, MD  naproxen sodium (ANAPROX) 220 MG tablet Take 440 mg by mouth 2 (two) times daily as needed. For pain     [provider]  neomycin-bacitracin-polymyxin (NEOSPORIN) ointment Apply topically daily. apply to eye     [provider]  omeprazole (PRILOSEC) 20 MG capsule Take 1 capsule (20 mg total) by mouth 2 (two) times daily before a meal. 02/01/19   Geoffery Lyonselo, Douglas, MD  ondansetron (ZOFRAN ODT) 8 MG disintegrating tablet 8mg  ODT q4 hours prn nausea 02/01/19   Geoffery Lyonselo, Douglas, MD  pantoprazole (PROTONIX) 20 MG tablet Take 1 tablet (20 mg total) by mouth daily. 07/17/18   Rolan BuccoBelfi, Melanie, MD  sucralfate (CARAFATE) 1 g tablet Take 1 tablet (1 g total) by mouth 4 (four) times daily -  with meals and at bedtime for 7 days. 10/18/18 10/25/18  Long, Arlyss RepressJoshua G, MD    Family History No family history on file.  Social History Social History   Tobacco Use  . Smoking status: Current Every Day Smoker    Packs/day: 0.50    Types: Cigarettes  . Smokeless tobacco: Never Used  Substance Use Topics  . Alcohol use: Not Currently  . Drug use: Not Currently    Types: Marijuana     Allergies  Patient has no known allergies.   Review of Systems Review of Systems  Constitutional: Negative for fever.  HENT: Negative for rhinorrhea and sore throat.   Eyes: Negative for redness.  Respiratory: Negative for cough.   Cardiovascular: Negative for chest pain.  Gastrointestinal: Positive for abdominal pain, nausea and vomiting. Negative for diarrhea.  Genitourinary: Negative for dysuria.  Musculoskeletal: Negative for myalgias.  Skin: Negative for rash.  Neurological: Negative for headaches.     Physical Exam Updated Vital Signs BP (!) 154/117 (BP Location: Left Arm)    Pulse 92   Temp 99 F (37.2 C) (Oral)   Resp 18   Ht 5\' 8"  (1.727 m)   Wt 79 kg   SpO2 100%   BMI 26.48 kg/m   Physical Exam Vitals signs and nursing note reviewed.  Constitutional:      Appearance: He is well-developed.  HENT:     Head: Normocephalic and atraumatic.  Eyes:     General:        Right eye: No discharge.        Left eye: No discharge.     Conjunctiva/sclera: Conjunctivae normal.  Neck:     Musculoskeletal: Normal range of motion and neck supple.  Cardiovascular:     Rate and Rhythm: Normal rate and regular rhythm.     Heart sounds: Normal heart sounds.  Pulmonary:     Effort: Pulmonary effort is normal.     Breath sounds: Normal breath sounds.  Abdominal:     Palpations: Abdomen is soft.     Tenderness: There is abdominal tenderness. There is no guarding or rebound.     Comments: Patient with epigastric tenderness to palpation.  No rebound or guarding.  Skin:    General: Skin is warm and dry.  Neurological:     Mental Status: He is alert.      ED Treatments / Results  Labs (all labs ordered are listed, but only abnormal results are displayed) Labs Reviewed  COMPREHENSIVE METABOLIC PANEL - Abnormal; Notable for the following components:      Result Value   Potassium 3.0 (*)    Glucose, Bld 164 (*)    BUN <5 (*)    AST 14 (*)    All other components within normal limits  CBC WITH DIFFERENTIAL/PLATELET  LIPASE, BLOOD  URINALYSIS, ROUTINE W REFLEX MICROSCOPIC    EKG None  Radiology No results found.  Procedures Procedures (including critical care time)  Medications Ordered in ED Medications  sodium chloride 0.9 % bolus 1,000 mL (0 mLs Intravenous Stopped 04/02/19 1651)  ondansetron (ZOFRAN) injection 4 mg (4 mg Intravenous Given 04/02/19 1529)  pantoprazole (PROTONIX) injection 40 mg (40 mg Intravenous Given 04/02/19 1529)  fentaNYL (SUBLIMAZE) injection 50 mcg (50 mcg Intravenous Given 04/02/19 1529)     Initial Impression / Assessment  and Plan / ED Course  I have reviewed the triage vital signs and the nursing notes.  Pertinent labs & imaging results that were available during my care of the patient were reviewed by me and considered in my medical decision making (see chart for details).        Patient seen and examined. Work-up initiated. Medications ordered.  Reviewed patient notes in care everywhere.  I cannot explicitly see his endoscopy reports but he has been diagnosed with H. pylori on biopsy.  This explains current antibiotic use. Vital signs reviewed and are as follows: BP (!) 154/117 (BP Location: Left Arm)   Pulse 92  Temp 99 F (37.2 C) (Oral)   Resp 18   Ht 5\' 8"  (1.727 m)   Wt 79 kg   SpO2 100%   BMI 26.48 kg/m   6:29 PM patient with consistent improvement while in the emergency department with treatment here.  Symptoms are well enough controlled that he feels comfortable going home.  He has been tolerating some oral fluids here.  Will give prescription for Phenergan for nausea, potassium supplement, #5 oxycodone 5 mg tablets for pain.  Patient encouraged to follow-up with his GI doctor for continued treatment of his H. pylori infection.  Patient counseled on use of narcotic pain medications. Counseled not to combine these medications with others containing tylenol. Urged not to drink alcohol, drive, or perform any other activities that requires focus while taking these medications. The patient verbalizes understanding and agrees with the plan.  The patient was urged to return to the Emergency Department immediately with worsening of current symptoms, worsening abdominal pain, persistent vomiting, blood noted in stools, fever, or any other concerns. The patient verbalized understanding.    Final Clinical Impressions(s) / ED Diagnoses   Final diagnoses:  Other acute gastritis without hemorrhage  Non-intractable vomiting with nausea, unspecified vomiting type  Hypokalemia   Patient with peptic  ulcer disease/recently diagnosed H. pylori infection on endoscopy.  Patient with epigastric abdominal pain, nausea and vomiting.  He was treated in the emergency department with antiemetics, pain medication, fluids.  He has gradually improved during his stay and is ready for discharge.  Lab work-up is reassuring.  Potassium is 3.0 today.  He will be given a potassium supplement to take over the next week.  He has appropriate GI follow-up and plans to continue follow-up for treatment of his symptoms.  ED Discharge Orders         Ordered    promethazine (PHENERGAN) 25 MG tablet  Every 6 hours PRN     04/02/19 1826    oxycodone (OXY-IR) 5 MG capsule  Every 4 hours PRN     04/02/19 1826    potassium chloride SA (K-DUR) 20 MEQ tablet  2 times daily     04/02/19 1829           Renne Crigler, PA-C 04/02/19 1831    Cathren Laine, MD 04/02/19 [phone]

## 2019-04-02 NOTE — ED Triage Notes (Signed)
Pt reports he has gastritis and is having trouble eating and keeping down his meds. States he has been on antibiotics for the last 5 days. C/o abdominal pain and vomited x 5 today

## 2019-04-02 NOTE — Discharge Instructions (Signed)
Please read and follow all provided instructions.  Your diagnoses today include:  1. Other acute gastritis without hemorrhage   2. Non-intractable vomiting with nausea, unspecified vomiting type     Tests performed today include:  Blood counts and electrolytes - low potassium  Blood tests to check liver and kidney function  Blood tests to check pancreas function  Urine test to look for infection  Vital signs. See below for your results today.   Medications prescribed:   Phenergan (promethazine) - for nausea and vomiting   Oxycodone - narcotic pain medication  DO NOT drive or perform any activities that require you to be awake and alert because this medicine can make you drowsy.   Take any prescribed medications only as directed.  Home care instructions:   Follow any educational materials contained in this packet.  Follow-up instructions: Please follow-up with your primary care provider in the next 3 days for further evaluation of your symptoms.    Return instructions:  SEEK IMMEDIATE MEDICAL ATTENTION IF:  The pain does not go away or becomes severe   A temperature above 101F develops   Repeated vomiting occurs (multiple episodes)   The pain becomes localized to portions of the abdomen. The right side could possibly be appendicitis. In an adult, the left lower portion of the abdomen could be colitis or diverticulitis.   Blood is being passed in stools or vomit (bright red or black tarry stools)   You develop chest pain, difficulty breathing, dizziness or fainting, or become confused, poorly responsive, or inconsolable (young children)  If you have any other emergent concerns regarding your health  Additional Information: Abdominal (belly) pain can be caused by many things. Your caregiver performed an examination and possibly ordered blood/urine tests and imaging (CT scan, x-rays, ultrasound). Many cases can be observed and treated at home after initial  evaluation in the emergency department. Even though you are being discharged home, abdominal pain can be unpredictable. Therefore, you need a repeated exam if your pain does not resolve, returns, or worsens. Most patients with abdominal pain don't have to be admitted to the hospital or have surgery, but serious problems like appendicitis and gallbladder attacks can start out as nonspecific pain. Many abdominal conditions cannot be diagnosed in one visit, so follow-up evaluations are very important.  Your vital signs today were: BP (!) 154/117 (BP Location: Left Arm)    Pulse 92    Temp 99 F (37.2 C) (Oral)    Resp 18    Ht 5\' 8"  (1.727 m)    Wt 79 kg    SpO2 100%    BMI 26.48 kg/m  If your blood pressure (bp) was elevated above 135/85 this visit, please have this repeated by your doctor within one month. --------------

## 2019-04-02 NOTE — ED Notes (Signed)
Ginger Ale provided for fluid challenge

## 2019-05-18 ENCOUNTER — Encounter (HOSPITAL_BASED_OUTPATIENT_CLINIC_OR_DEPARTMENT_OTHER): Payer: Self-pay

## 2019-05-18 ENCOUNTER — Other Ambulatory Visit: Payer: Self-pay

## 2019-05-18 ENCOUNTER — Emergency Department (HOSPITAL_BASED_OUTPATIENT_CLINIC_OR_DEPARTMENT_OTHER)
Admission: EM | Admit: 2019-05-18 | Discharge: 2019-05-18 | Disposition: A | Discharge status: Home / Self Care | Payer: Self-pay | Attending: Emergency Medicine | Admitting: Emergency Medicine

## 2019-05-18 DIAGNOSIS — K2900 Acute gastritis without bleeding: Secondary | ICD-10-CM | POA: Insufficient documentation

## 2019-05-18 DIAGNOSIS — Z79899 Other long term (current) drug therapy: Secondary | ICD-10-CM | POA: Insufficient documentation

## 2019-05-18 DIAGNOSIS — F17210 Nicotine dependence, cigarettes, uncomplicated: Secondary | ICD-10-CM | POA: Insufficient documentation

## 2019-05-18 LAB — URINALYSIS, ROUTINE W REFLEX MICROSCOPIC
Bilirubin Urine: NEGATIVE
Glucose, UA: NEGATIVE mg/dL
Hgb urine dipstick: NEGATIVE
Ketones, ur: 15 mg/dL — AB
Leukocytes,Ua: NEGATIVE
Nitrite: NEGATIVE
Protein, ur: NEGATIVE mg/dL
Specific Gravity, Urine: <1.005 — ABNORMAL LOW (ref 1.005–1.030)
pH: 7.5 (ref 5.0–8.0)

## 2019-05-18 LAB — RAPID URINE DRUG SCREEN, HOSP PERFORMED
Amphetamines: NOT DETECTED
Barbiturates: NOT DETECTED
Benzodiazepines: NOT DETECTED
Cocaine: NOT DETECTED
Opiates: NOT DETECTED
Tetrahydrocannabinol: POSITIVE — AB

## 2019-05-18 MED ORDER — ONDANSETRON 4 MG PO TBDP
ORAL_TABLET | ORAL | Status: AC
  Filled 2019-05-18: qty 1

## 2019-05-18 MED ORDER — ONDANSETRON 4 MG PO TBDP
4.0000 mg | ORAL_TABLET | Freq: Once | ORAL | Status: AC
  Administered 2019-05-18: 4 mg via ORAL

## 2019-05-18 MED ORDER — ALUM & MAG HYDROXIDE-SIMETH 200-200-20 MG/5ML PO SUSP
30.0000 mL | Freq: Once | ORAL | Status: AC
  Administered 2019-05-18: 30 mL via ORAL
  Filled 2019-05-18: qty 30

## 2019-05-18 MED ORDER — ONDANSETRON 4 MG PO TBDP: 4.0000 mg | ORAL_TABLET | Freq: Three times a day (TID) | ORAL | 0 refills | Status: AC | PRN

## 2019-05-18 NOTE — ED Triage Notes (Signed)
Pt c/o abd pain and n/v x 2 days-NAD-to triage in w/c

## 2019-05-18 NOTE — ED Provider Notes (Addendum)
[facility] DEPARTMENT Provider Note   CSN: [id number] Arrival date & time: 05/18/19  1232     History   Chief Complaint Chief Complaint  Patient presents with  . Abdominal Pain    HPI Frank Mclaughlin is a 38 y.o. male history of gastritis, presenting to the emergency department with 2 days of epigastric abdominal pain, nausea and vomiting.  Symptoms feel very similar to recurrent gastritis flares. He states his symptoms began on Tuesday after drinking a cup of coffee. He has epigastric abdominal discomfort and decreased appetite. He has been tolerating liquids, and has only had a few episodes of vomiting over the last few days. He used to take zofran for his nausea however ran out.  He states he had an endoscopy about 1 month ago where they tested him for H. pylori.  His EGD was positive for gastritis and H. pylori.  He states he was prescribed 2 weeks of antibiotics, however cannot tolerate them and not finish them yet though he intends to finish the antibiotics.  He takes omeprazole daily for his gastritis.  He also endorses NSAID use, multiple times per week for his chronic back pain.  He denies alcohol use.     The history is provided by the patient.    Past Medical History:  Diagnosis Date  . Gastritis     Patient Active Problem List   Diagnosis Date Noted  . Hidradenitis 04/24/2011    History reviewed. No pertinent surgical history.      Home Medications    Prior to Admission medications   Medication Sig Start Date End Date Taking? Authorizing Provider  amoxicillin-clavulanate (AUGMENTIN) 875-125 MG tablet Take 1 tablet by mouth every 12 (twelve) hours. 03/01/19   Kinnie Feil, PA-C  lidocaine (XYLOCAINE) 2 % solution Use as directed 15 mLs in the mouth or throat every 3 (three) hours as needed for mouth pain. 10/13/13   Carmin Muskrat, MD  naproxen sodium (ANAPROX) 220 MG tablet Take 440 mg by mouth 2 (two) times daily as needed. For pain      [provider]  neomycin-bacitracin-polymyxin (NEOSPORIN) ointment Apply topically daily. apply to eye     [provider]  omeprazole (PRILOSEC) 20 MG capsule Take 1 capsule (20 mg total) by mouth 2 (two) times daily before a meal. 02/01/19   Veryl Speak, MD  ondansetron (ZOFRAN ODT) 4 MG disintegrating tablet Take 1 tablet (4 mg total) by mouth every 8 (eight) hours as needed for nausea or vomiting. 05/18/19   Jaquay Morneault, [place] N, PA-C  oxycodone (OXY-IR) 5 MG capsule Take 1 capsule (5 mg total) by mouth every 4 (four) hours as needed. 04/02/19   Carlisle Cater, PA-C  pantoprazole (PROTONIX) 20 MG tablet Take 1 tablet (20 mg total) by mouth daily. 07/17/18   Malvin Johns, MD  potassium chloride SA (K-DUR) 20 MEQ tablet Take 1 tablet (20 mEq total) by mouth 2 (two) times daily. 04/02/19   Carlisle Cater, PA-C  promethazine (PHENERGAN) 25 MG tablet Take 1 tablet (25 mg total) by mouth every 6 (six) hours as needed for nausea or vomiting. 04/02/19   Carlisle Cater, PA-C  sucralfate (CARAFATE) 1 g tablet Take 1 tablet (1 g total) by mouth 4 (four) times daily -  with meals and at bedtime for 7 days. 10/18/18 10/25/18  Long, Wonda Olds, MD    Family History No family history on file.  Social History Social History   Tobacco Use  .  Smoking status: Current Every Day Smoker    Packs/day: 0.50    Types: Cigarettes  . Smokeless tobacco: Never Used  Substance Use Topics  . Alcohol use: Not Currently  . Drug use: Not Currently    Types: Marijuana     Allergies   Patient has no known allergies.   Review of Systems Review of Systems  All other systems reviewed and are negative.    Physical Exam Updated Vital Signs BP 126/87 (BP Location: Right Arm)   Pulse 86   Temp 98.4 F (36.9 C) (Oral)   Resp 18   Ht 5\' 8"  (1.727 m)   Wt 81.6 kg   SpO2 100%   BMI 27.37 kg/m   Physical Exam Vitals signs and nursing note reviewed.  Constitutional:      General: He is not in  acute distress.    Appearance: He is well-developed. He is not ill-appearing.     Comments: Pt talking on the phone, in no distress  HENT:     Head: Normocephalic and atraumatic.     Mouth/Throat:     Mouth: Mucous membranes are moist.  Eyes:     Conjunctiva/sclera: Conjunctivae normal.  Cardiovascular:     Rate and Rhythm: Normal rate and regular rhythm.  Pulmonary:     Effort: Pulmonary effort is normal. No respiratory distress.     Breath sounds: Normal breath sounds.  Abdominal:     General: Bowel sounds are normal.     Palpations: Abdomen is soft.     Tenderness: There is no abdominal tenderness. There is no guarding or rebound.  Skin:    General: Skin is warm.  Neurological:     Mental Status: He is alert.  Psychiatric:        Behavior: Behavior normal.      ED Treatments / Results  Labs (all labs ordered are listed, but only abnormal results are displayed) Labs Reviewed  URINALYSIS, ROUTINE W REFLEX MICROSCOPIC - Abnormal; Notable for the following components:      Result Value   Specific Gravity, Urine <1.005 (*)    Ketones, ur 15 (*)    All other components within normal limits  RAPID URINE DRUG SCREEN, HOSP PERFORMED    EKG None  Radiology No results found.  Procedures Procedures (including critical care time)  Medications Ordered in ED Medications  ondansetron (ZOFRAN-ODT) disintegrating tablet 4 mg (4 mg Oral Given 05/18/19 1452)  alum & mag hydroxide-simeth (MAALOX/MYLANTA) 200-200-20 MG/5ML suspension 30 mL (30 mLs Oral Given 05/18/19 1456)     Initial Impression / Assessment and Plan / ED Course  I have reviewed the triage vital signs and the nursing notes.  Pertinent labs & imaging results that were available during my care of the patient were reviewed by me and considered in my medical decision making (see chart for details).        Pt with hx of GERD and recurrent gastritis, recently diagnosed with h.pylori however did not complete  treatment. Pt presenting with recurrent episode of gastritis flare that began Tuesday.  He states his symptoms are typical for his gastritis flare.  He has been able to keep down liquids. He treats with omeprazole although does take NSAIDs and have some poor dietary choices considering his history of GERD.  On exam, he is in no distress.  His abdomen is soft and nontender.  Afebrile. Pt has only had a few episodes of vomiting over the last few days and is tolerating PO,  low suspicion for electrolyte derangement or emergent intraabdominal pathology.  Provided education to patient regarding NSAID use being detrimental with gastritis as well as dietary modifications for GERD. Discussed importance of f/u with PCP/Gi specialist for proper treatment of H. Pylori. Instructed pt importance of taking antibiotics as prescribed until gone, and H. Pylori likely not treated sufficiently without completing course. Pt verbalized understanding and importance of outpt follow up. Will provide educational resources with discharge paperwork for regular management of GERD. Encourage continue taking omeprazole. Believe pt is stable for discharge at this time.   Discussed results, findings, treatment and follow up. Patient advised of return precautions. Patient verbalized understanding and agreed with plan.  Final Clinical Impressions(s) / ED Diagnoses   Final diagnoses:  Acute gastritis without hemorrhage, unspecified gastritis type    ED Discharge Orders         Ordered    ondansetron (ZOFRAN ODT) 4 MG disintegrating tablet  Every 8 hours PRN     05/18/19 1511           Lashun Ramseyer, [place] N, PA-C 05/18/19 1513    Arietta Eisenstein, [place] N, PA-C 05/18/19 1538    Little, Ambrose Finlandachel Morgan, MD 05/19/19 [phone]

## 2019-05-18 NOTE — Discharge Instructions (Signed)
Please read instructions below. Drink clear liquids until your stomach feels better. Then, slowly introduce bland foods into your diet as tolerated.  Avoid spicy, greasy, acidic foods as this can worsen your symptoms.  Avoid NSAID medications such as Advil/ibuprofen/Motrin, Aleve, aspirin, Goody's powder, BC powder.  Remain is sitting upright for at least 45 minutes after meals. Continue taking your  omeprazole daily.  Follow up with your primary care provider. Return to the ER for severely worsening abdominal pain, fever, uncontrollable vomiting, or vomiting blood.

## 2019-05-18 NOTE — ED Notes (Signed)
Pt unable to provide urine sample at this time- will attempt shortly.

## 2020-05-27 IMAGING — CR DG NECK SOFT TISSUE
2 series · 2 of 2 positions shown · non-contrast
Comparison: None.

CLINICAL DATA: Sore throat and headache ups over the last 4 days.

EXAM:
NECK SOFT TISSUES - 1+ VIEW

[w soft tissue neck]
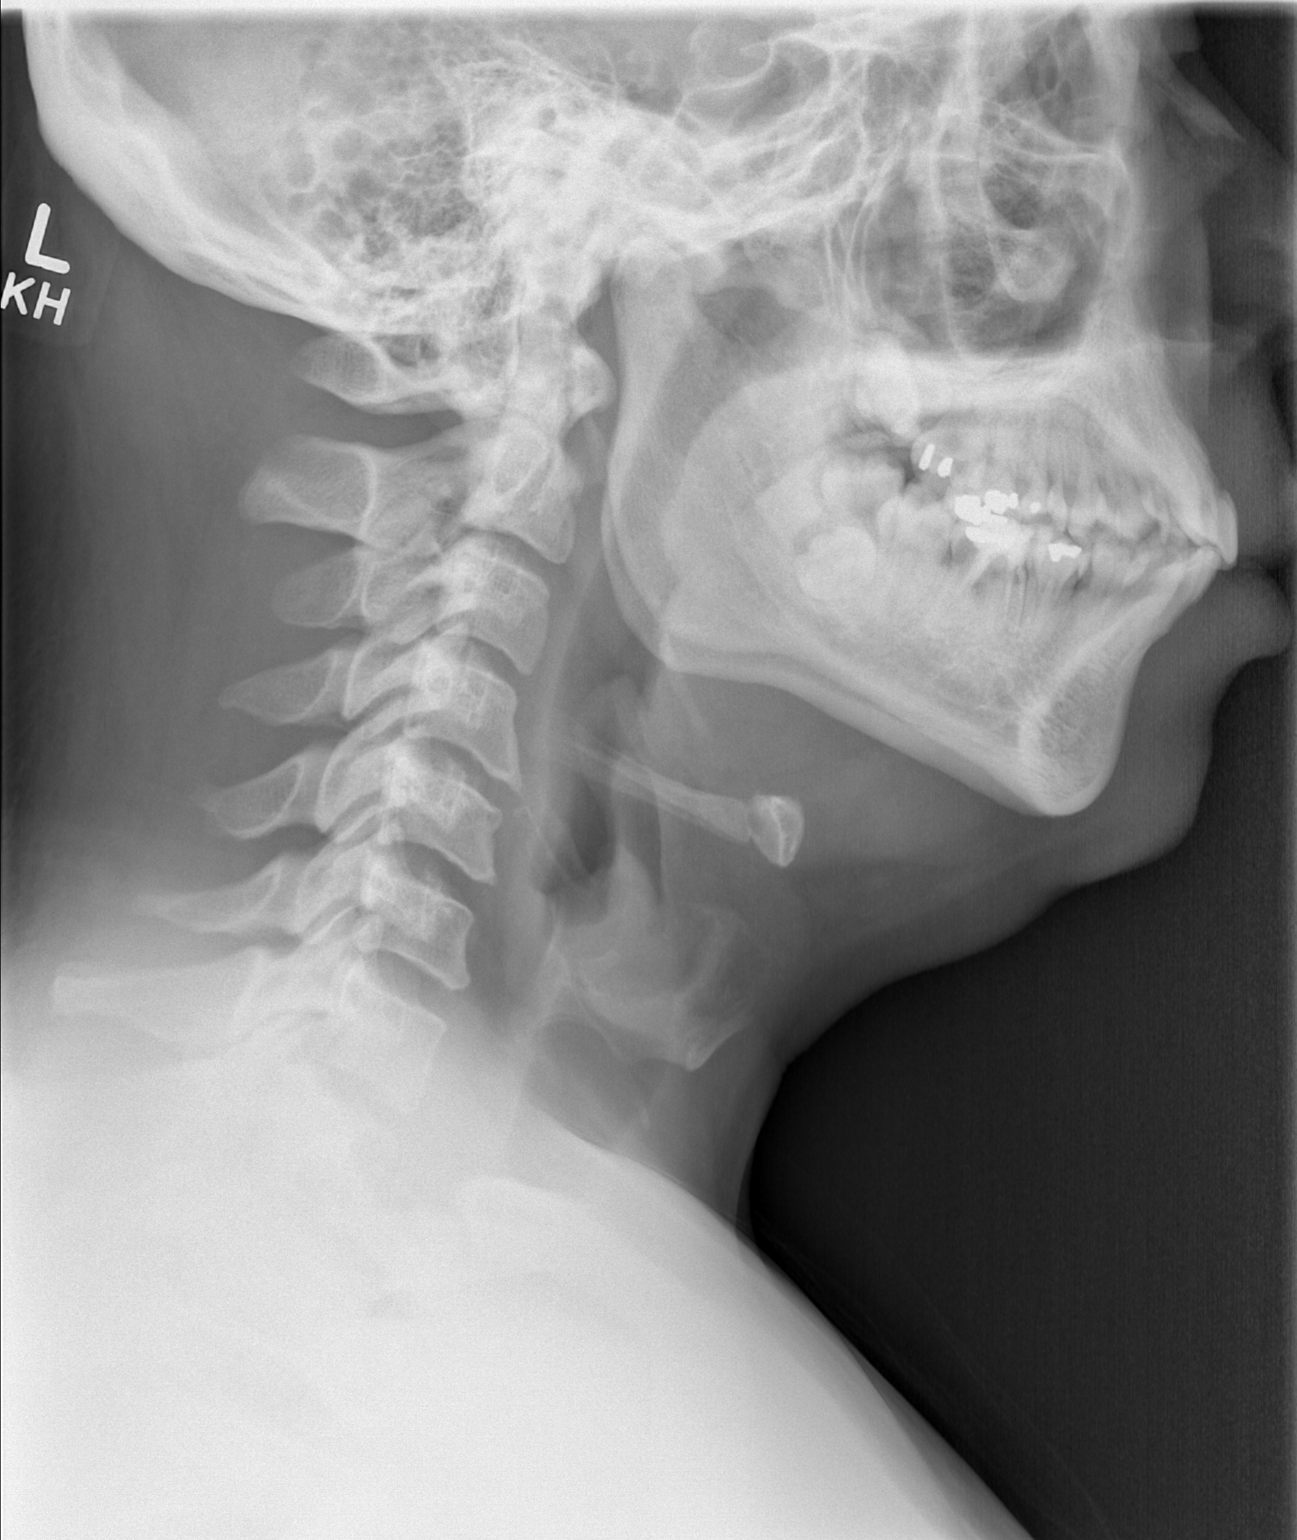

[w soft tissue neck ap]
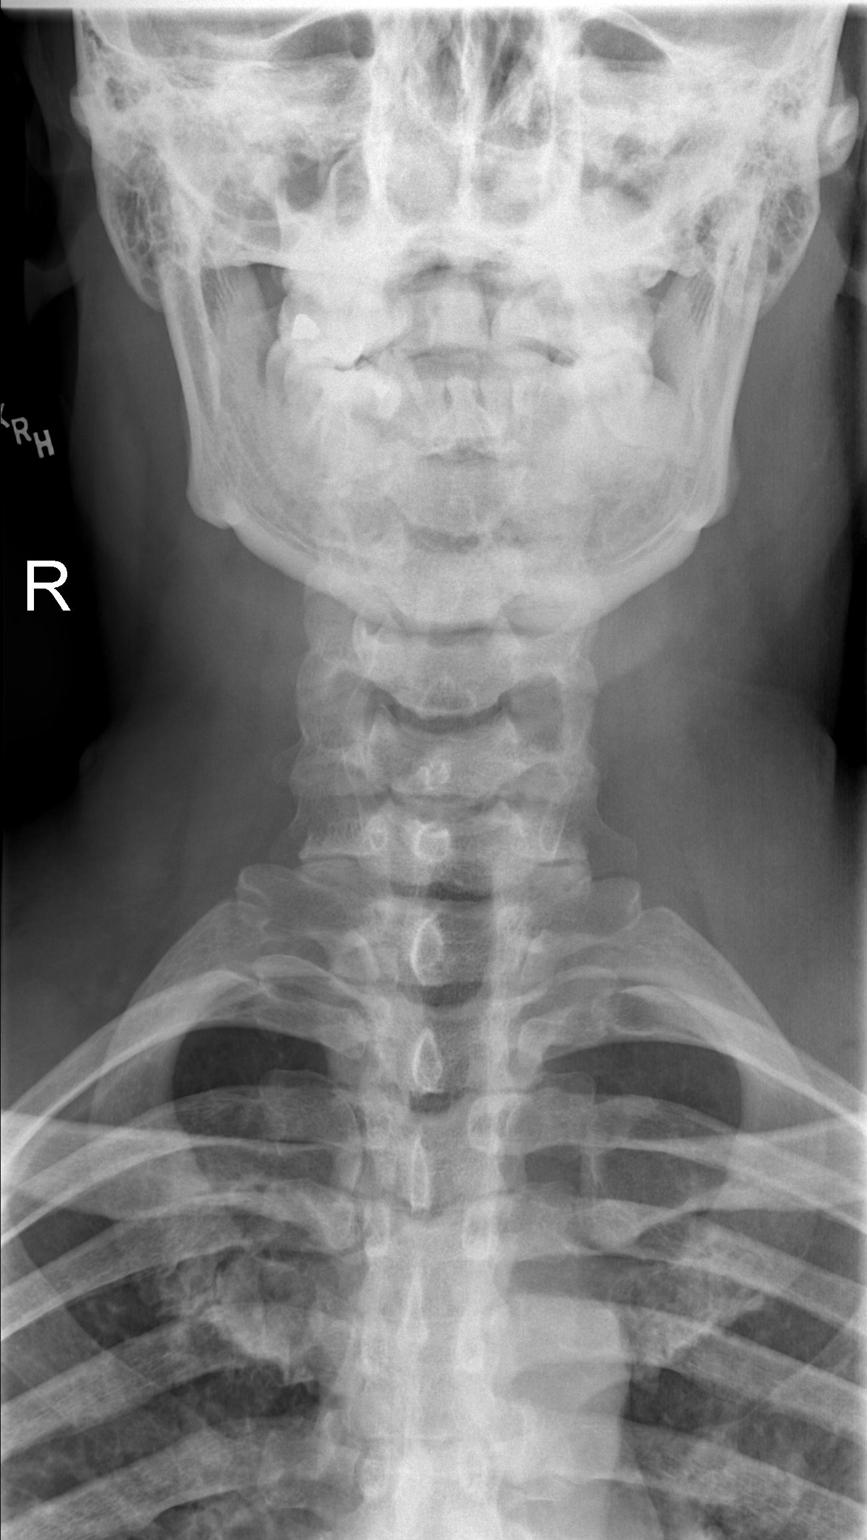

[2 of 2 positions shown; findings below may reference images not displayed]

FINDINGS: Soft tissue and air shadows appear within normal limits. No evidence
of airway compromise. No evidence of retropharyngeal abscess. No
significant bone finding.
IMPRESSION: Within normal limits.

## 2020-12-02 ENCOUNTER — Other Ambulatory Visit: Payer: Self-pay

## 2020-12-02 ENCOUNTER — Ambulatory Visit (HOSPITAL_COMMUNITY)
Admission: EM | Admit: 2020-12-02 | Discharge: 2020-12-02 | Disposition: A | Discharge status: Home / Self Care | Payer: Self-pay | Attending: Emergency Medicine | Admitting: Emergency Medicine

## 2020-12-02 ENCOUNTER — Encounter (HOSPITAL_COMMUNITY): Payer: Self-pay

## 2020-12-02 DIAGNOSIS — Z113 Encounter for screening for infections with a predominantly sexual mode of transmission: Secondary | ICD-10-CM | POA: Insufficient documentation

## 2020-12-02 LAB — HIV ANTIBODY (ROUTINE TESTING W REFLEX): HIV Screen 4th Generation wRfx: NONREACTIVE

## 2020-12-02 NOTE — Discharge Instructions (Addendum)
Lab result 2-3 days, you will be called if positive for infection  Please refrain from sex until labs results, if positive refrain from sex until treatment complete

## 2020-12-02 NOTE — ED Provider Notes (Signed)
[facility]    CSN: [id number] Arrival date & time: 12/02/20  1621      History   Chief Complaint Chief Complaint  Patient presents with  . SEXUALLY TRANSMITTED DISEASE    HPI Frank Mclaughlin is a 40 y.o. male.   Patient presents requesting sti testing. Partner notified him of exposure to trichomoniasis. Denies discharge, itching, dysuria, frequency, urgency, penile or testicle swelling, abdominal pain, flank pain. 2 partners, no condom use.   Past Medical History:  Diagnosis Date  . Gastritis     Patient Active Problem List   Diagnosis Date Noted  . Hidradenitis 04/24/2011    History reviewed. No pertinent surgical history.     Home Medications    Prior to Admission medications   Medication Sig Start Date End Date Taking? Authorizing Provider  amoxicillin-clavulanate (AUGMENTIN) 875-125 MG tablet Take 1 tablet by mouth every 12 (twelve) hours. 03/01/19   Liberty Handy, PA-C  lidocaine (XYLOCAINE) 2 % solution Use as directed 15 mLs in the mouth or throat every 3 (three) hours as needed for mouth pain. 10/13/13   Gerhard Munch, MD  naproxen sodium (ANAPROX) 220 MG tablet Take 440 mg by mouth 2 (two) times daily as needed. For pain     [provider]  neomycin-bacitracin-polymyxin (NEOSPORIN) ointment Apply topically daily. apply to eye     [provider]  omeprazole (PRILOSEC) 20 MG capsule Take 1 capsule (20 mg total) by mouth 2 (two) times daily before a meal. 02/01/19   Geoffery Lyons, MD  ondansetron (ZOFRAN ODT) 4 MG disintegrating tablet Take 1 tablet (4 mg total) by mouth every 8 (eight) hours as needed for nausea or vomiting. 05/18/19   Robinson, [place] N, PA-C  oxycodone (OXY-IR) 5 MG capsule Take 1 capsule (5 mg total) by mouth every 4 (four) hours as needed. 04/02/19   Renne Crigler, PA-C  pantoprazole (PROTONIX) 20 MG tablet Take 1 tablet (20 mg total) by mouth daily. 07/17/18   Rolan Bucco, MD  potassium chloride SA  (K-DUR) 20 MEQ tablet Take 1 tablet (20 mEq total) by mouth 2 (two) times daily. 04/02/19   Renne Crigler, PA-C  promethazine (PHENERGAN) 25 MG tablet Take 1 tablet (25 mg total) by mouth every 6 (six) hours as needed for nausea or vomiting. 04/02/19   Renne Crigler, PA-C  sucralfate (CARAFATE) 1 g tablet Take 1 tablet (1 g total) by mouth 4 (four) times daily -  with meals and at bedtime for 7 days. 10/18/18 10/25/18  Long, Arlyss Repress, MD    Family History History reviewed. No pertinent family history.  Social History Social History   Tobacco Use  . Smoking status: Current Every Day Smoker    Packs/day: 0.50    Types: Cigarettes  . Smokeless tobacco: Never Used  Vaping Use  . Vaping Use: Never used  Substance Use Topics  . Alcohol use: Not Currently  . Drug use: Not Currently    Types: Marijuana     Allergies   Patient has no known allergies.   Review of Systems Review of Systems  Constitutional: Negative.   Respiratory: Negative.   Cardiovascular: Negative.   Genitourinary: Negative.   Skin: Negative.   Neurological: Negative.      Physical Exam Triage Vital Signs ED Triage Vitals  Enc Vitals Group     BP 12/02/20 1726 140/84     Pulse Rate 12/02/20 1726 91     Resp 12/02/20 1726 19  Temp 12/02/20 1726 98.4 F (36.9 C)     Temp Source 12/02/20 1726 Oral     SpO2 12/02/20 1726 100 %     Weight --      Height --      Head Circumference --      Peak Flow --      Pain Score 12/02/20 1725 0     Pain Loc --      Pain Edu? --      Excl. in GC? --    No data found.  Updated Vital Signs BP 140/84 (BP Location: Right Arm)   Pulse 91   Temp 98.4 F (36.9 C) (Oral)   Resp 19   SpO2 100%   Visual Acuity Right Eye Distance:   Left Eye Distance:   Bilateral Distance:    Right Eye Near:   Left Eye Near:    Bilateral Near:     Physical Exam Constitutional:      Appearance: Normal appearance. He is normal weight.  HENT:     Head: Normocephalic.  Eyes:      Extraocular Movements: Extraocular movements intact.  Pulmonary:     Effort: Pulmonary effort is normal.  Genitourinary:    Comments: Deferred, self collect of swab Musculoskeletal:        General: Normal range of motion.  Skin:    General: Skin is warm and dry.  Neurological:     Mental Status: He is alert and oriented to person, place, and time. Mental status is at baseline.  Psychiatric:        Mood and Affect: Mood normal.        Behavior: Behavior normal.        Thought Content: Thought content normal.        Judgment: Judgment normal.      UC Treatments / Results  Labs (all labs ordered are listed, but only abnormal results are displayed) Labs Reviewed  HIV ANTIBODY (ROUTINE TESTING W REFLEX)  RPR  CYTOLOGY, (ORAL, ANAL, URETHRAL) ANCILLARY ONLY    EKG   Radiology No results found.  Procedures Procedures (including critical care time)  Medications Ordered in UC Medications - No data to display  Initial Impression / Assessment and Plan / UC Course  I have reviewed the triage vital signs and the nursing notes.  Pertinent labs & imaging results that were available during my care of the patient were reviewed by me and considered in my medical decision making (see chart for details).  Routine screening for STI  1. Cytology swab and blood work pending, will treat per protocol. 2. Advised abstinence while labs result and/or treatment complete  Final Clinical Impressions(s) / UC Diagnoses   Final diagnoses:  Routine screening for STI (sexually transmitted infection)     Discharge Instructions     Lab result 2-3 days, you will be called if positive for infection  Please refrain from sex until labs results, if positive refrain from sex until treatment complete    ED Prescriptions    None     PDMP not reviewed this encounter.   Valinda Hoar, [place] 12/02/20 [phone]

## 2020-12-02 NOTE — ED Triage Notes (Signed)
Pt is here to be tested for STD. He states his recent partner has an STI. Pt denies sxs.

## 2020-12-03 LAB — CYTOLOGY, (ORAL, ANAL, URETHRAL) ANCILLARY ONLY
Chlamydia: NEGATIVE
Comment: NEGATIVE
Comment: NEGATIVE
Comment: NORMAL
Neisseria Gonorrhea: NEGATIVE
Trichomonas: NEGATIVE

## 2020-12-03 LAB — RPR: RPR Ser Ql: NONREACTIVE

## 2024-07-13 DEATH — deceased
